# Patient Record
Sex: Female | Born: 1945 | Race: Black or African American | Hispanic: No | Smoking: Former smoker
Health system: Southern US, Community
[De-identification: ages and names within clinical notes are randomized; demographics above are authoritative.]

## PROBLEM LIST (undated history)

## (undated) DIAGNOSIS — M79671 Pain in right foot: Secondary | ICD-10-CM

## (undated) DIAGNOSIS — M545 Low back pain, unspecified: Secondary | ICD-10-CM

## (undated) DIAGNOSIS — I10 Essential (primary) hypertension: Secondary | ICD-10-CM

## (undated) DIAGNOSIS — N950 Postmenopausal bleeding: Secondary | ICD-10-CM

## (undated) DIAGNOSIS — E119 Type 2 diabetes mellitus without complications: Secondary | ICD-10-CM

## (undated) DIAGNOSIS — E7800 Pure hypercholesterolemia, unspecified: Secondary | ICD-10-CM

## (undated) DIAGNOSIS — I639 Cerebral infarction, unspecified: Secondary | ICD-10-CM

## (undated) DIAGNOSIS — M79672 Pain in left foot: Secondary | ICD-10-CM

## (undated) DIAGNOSIS — E538 Deficiency of other specified B group vitamins: Secondary | ICD-10-CM

## (undated) DIAGNOSIS — E782 Mixed hyperlipidemia: Secondary | ICD-10-CM

## (undated) DIAGNOSIS — K219 Gastro-esophageal reflux disease without esophagitis: Secondary | ICD-10-CM

## (undated) DIAGNOSIS — M5416 Radiculopathy, lumbar region: Secondary | ICD-10-CM

## (undated) HISTORY — DX: Essential (primary) hypertension: I10

## (undated) HISTORY — DX: Gastro-esophageal reflux disease without esophagitis: K21.9

## (undated) HISTORY — PX: COLPOSCOPY: SHX161

## (undated) HISTORY — DX: Pure hypercholesterolemia, unspecified: E78.00

## (undated) HISTORY — DX: Type 2 diabetes mellitus without complications: E11.9

---
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
## 2012-07-25 ENCOUNTER — Ambulatory Visit: Payer: Self-pay | Admitting: Internal Medicine

## 2013-09-11 ENCOUNTER — Ambulatory Visit: Payer: Self-pay | Admitting: Gastroenterology

## 2014-07-02 DIAGNOSIS — N950 Postmenopausal bleeding: Secondary | ICD-10-CM | POA: Diagnosis not present

## 2015-01-29 ENCOUNTER — Other Ambulatory Visit: Payer: Self-pay | Admitting: Internal Medicine

## 2015-01-29 DIAGNOSIS — Z1239 Encounter for other screening for malignant neoplasm of breast: Secondary | ICD-10-CM

## 2015-02-05 ENCOUNTER — Other Ambulatory Visit: Payer: Self-pay | Admitting: Internal Medicine

## 2015-02-05 ENCOUNTER — Ambulatory Visit
Admission: RE | Admit: 2015-02-05 | Discharge: 2015-02-05 | Disposition: A | Discharge status: Home / Self Care | Payer: Medicare Other | Source: Ambulatory Visit | Attending: Internal Medicine | Admitting: Internal Medicine

## 2015-02-05 DIAGNOSIS — R928 Other abnormal and inconclusive findings on diagnostic imaging of breast: Secondary | ICD-10-CM | POA: Insufficient documentation

## 2015-02-05 DIAGNOSIS — Z1239 Encounter for other screening for malignant neoplasm of breast: Secondary | ICD-10-CM

## 2015-02-05 DIAGNOSIS — Z1231 Encounter for screening mammogram for malignant neoplasm of breast: Secondary | ICD-10-CM | POA: Insufficient documentation

## 2015-02-15 ENCOUNTER — Other Ambulatory Visit: Payer: Self-pay | Admitting: Internal Medicine

## 2015-02-15 DIAGNOSIS — R921 Mammographic calcification found on diagnostic imaging of breast: Secondary | ICD-10-CM

## 2015-02-15 DIAGNOSIS — R928 Other abnormal and inconclusive findings on diagnostic imaging of breast: Secondary | ICD-10-CM

## 2015-03-03 ENCOUNTER — Ambulatory Visit
Admission: RE | Admit: 2015-03-03 | Discharge: 2015-03-03 | Disposition: A | Discharge status: Home / Self Care | Payer: Medicare Other | Source: Ambulatory Visit | Attending: Internal Medicine | Admitting: Internal Medicine

## 2015-03-03 ENCOUNTER — Encounter: Payer: Self-pay | Admitting: Obstetrics and Gynecology

## 2015-03-03 ENCOUNTER — Inpatient Hospital Stay: Payer: Medicare Other | Attending: Obstetrics and Gynecology | Admitting: Obstetrics and Gynecology

## 2015-03-03 ENCOUNTER — Ambulatory Visit: Payer: Medicare Other

## 2015-03-03 VITALS — BP 129/82 | HR 71 | Temp 98.2°F | Wt 188.7 lb

## 2015-03-03 DIAGNOSIS — R921 Mammographic calcification found on diagnostic imaging of breast: Secondary | ICD-10-CM | POA: Diagnosis not present

## 2015-03-03 DIAGNOSIS — R928 Other abnormal and inconclusive findings on diagnostic imaging of breast: Secondary | ICD-10-CM

## 2015-03-03 DIAGNOSIS — N950 Postmenopausal bleeding: Secondary | ICD-10-CM

## 2015-03-03 NOTE — Progress Notes (Signed)
Gynecologic Oncology Consult Visit   Referring Shenise Wolgamott: Dr. Atlee Abide  Chief Concern: postmenopausal vaginal spotting  Subjective:  Kara Rasmussen is a 69 y.o. G10 P7 female who is seen in consultation from Dr. Ilda Basset for postmenopausal spotting and suspected cervical stenosis. The spotting use to happen about once a year and now happens about once a month for the past 3-4 months.  She does not wear a pad, just occasionally sees a stain on her underwear.   Menopause since age 66.  In about 2011 she had some vaginal spotting evaluated in [place].  D&C attempted, but unable to locate her cervical canal. She declined hysterectomy.   Evaluated at [facility] since 2014 and recent US showed thin endometrium 4 mm. Uterus normal size and cervix only 8.7 mm long.   PAP and HPV normal.  Referred by Dr. Ilda Basset for opinion regarding whether hysteroscopy, D&C should be attempted again for evaluation, or possible hysterectomy.   Abnormal PAP 1989 and had colposcopy, but all normal since.  In questioning her about this, she says that a procedure was done in the OR and I suspect she probably had a cone biopsy.    Had 7 children, 2 by Cesarean section.  Sister had breast cancer at age 80.  Patient had neg BRCA1/2 testing.   Problem List: Patient Active Problem List   Diagnosis Date Noted  . Post-menopausal bleeding 03/03/2015    Past Medical History: Past Medical History  Diagnosis Date  . GERD (gastroesophageal reflux disease)   . Diabetes mellitus without complication     Type II  . Hypertension   . Hypercholesterolemia    Past Surgical History: see HPI  Family History: Family History  Problem Relation Age of Onset  . Breast cancer Sister 44    Social History: Social History   Social History  . Marital Status: Single    Spouse Name: N/A  . Number of Children: N/A  . Years of Education: N/A   Occupational History  . Not on file.   Social History Main Topics  .  Smoking status: Not on file  . Smokeless tobacco: Not on file  . Alcohol Use: Not on file  . Drug Use: Not on file  . Sexual Activity: Not on file   Other Topics Concern  . Not on file   Social History Narrative    Allergies: Allergies  Allergen Reactions  . Procaine Hives    Passed out    Current Medications: Current Outpatient Prescriptions  Medication Sig Dispense Refill  . aspirin 325 MG tablet Take 325 mg by mouth.    . folic acid (FOLVITE) 1 MG tablet Take 1 mg by mouth.    Marland Kitchen lisinopril-hydrochlorothiazide (PRINZIDE,ZESTORETIC) 10-12.5 MG tablet Take by mouth.    . metFORMIN (GLUCOPHAGE) 1000 MG tablet Take 1,000 mg by mouth.    . metFORMIN (GLUCOPHAGE) 1000 MG tablet     . omeprazole (PRILOSEC) 40 MG capsule Take by mouth.    . pravastatin (PRAVACHOL) 10 MG tablet Take 10 mg by mouth.     No current facility-administered medications for this visit.    Review of Systems General: negative for, fevers, chills, fatigue, changes in sleep, changes in weight or appetite Skin: negative for changes in color, texture, moles or lesions Eyes: negative for, changes in vision, pain, diplopia HEENT: negative for, change in hearing, pain, discharge, tinnitus, vertigo, voice changes, sore throat, neck masses Breasts: negative for breast lumps Pulmonary: negative for, dyspnea, orthopnea, productive cough Cardiac:  negative for, palpitations, syncope, pain, discomfort, pressure Gastrointestinal: negative for, dysphagia, nausea, vomiting, jaundice, pain, constipation, diarrhea, hematemesis, hematochezia Genitourinary/Sexual: negative for, dysuria, discharge, hesitancy, nocturia, retention, stones, infections, STD's, incontinence Ob/Gyn: negative for, irregular bleeding, pain Musculoskeletal: negative for, pain, stiffness, swelling, range of motion limitation Hematology: negative for, easy bruising, bleeding Neurologic/Psych: negative for, headaches, seizures, paralysis, weakness,  tremor, change in gait, change in sensation, mood swings, depression, anxiety, change in memory  Objective:  Physical Examination:  BP 129/82 mmHg  Pulse 71  Temp(Src) 98.2 F (36.8 C)  Wt 188 lb 11.4 oz (85.6 kg)   ECOG Performance Status: 0 - Asymptomatic  General appearance: alert, cooperative and appears stated age HEENT:PERRLA, extra ocular movement intact, neck supple with midline trachea and thyroid without masses Lymph node survey: non-palpable, axillary, inguinal, supraclavicular Cardiovascular: regular rate and rhythm, no murmurs or gallops Respiratory: normal air entry, lungs clear to auscultation and no rales, rhonchi or wheezing Breast exam: breasts appear normal, no suspicious masses, no skin or nipple changes or axillary nodes, not examined. Abdomen: soft, non-tender, without masses or organomegaly, no hernias and well healed incision Back: inspection of back is normal Extremities: extremities normal, atraumatic, no cyanosis or edema Skin exam - normal coloration and turgor, no rashes, no suspicious skin lesions noted. Neurological exam reveals alert, oriented, normal speech, no focal findings or movement disorder noted.  Pelvic: exam chaperoned by nurse;  Vulva: normal appearing vulva with no masses, tenderness or lesions; Vagina: normal vagina; Adnexa: normal adnexa in size, nontender and no masses no masses; Uterus: uterus is normal size, shape, consistency and nontender; Cervix: flush with the upper vault and not palpable, no lesions and the os is not identifiable; Rectal: not indicated and normal rectal, no masses    Assessment:  Kara Rasmussen is a 69 y.o. female diagnosed with intermittent vaginal spotting that is longstanding, but more frequent recently.  PAP smears and ultrasounds have been reassuring.  As best I can tell, she had a cone biopsy in 1989 and there is very little cervix present, and her endometrial thickness is normal.  I offered to try to perform  a hysteroscopy, D&C in the OR, but doubt it is technically possible.  There is no cervix palpable and no os visible.     Plan:   Problem List Items Addressed This Visit      Other   Post-menopausal bleeding - Primary     We discussed options for management including hysteroscopy and D&C, but based on my exam and the prior failed attempt in [place], it is unlikely that this will be successful even under anesthesia.  I also offered hysterectomy, but she does not want to do this, and I think this is reasonable given the lack of suspicion for cancer.  It is reassuring that she has had the spotting for some time and it is light and infrequent, and PAPs and [place] have been reassuring.  She would like to continue follow up with Dr Ilda Basset and agrees to repeat PAPs and ultrasounds.  I would recommend doing the next ultrasound in radiology in 4 months.  It may be worthwhile to try a course of premarin cream if this continues to be reassuring, as this might ameliorate this light spotting if it is due to atrophic vaginitis.      I would be happy to see her back again for follow up if needed.  I told her that if a hysterectomy was done at any point, we could try  to do this laparoscopically despite her prior Cesarean sections.    Mellody Drown, MD  CC:  Aletha Halim, MD [address] [place], [place] [address] [phone]

## 2015-03-03 NOTE — Progress Notes (Signed)
Patient seen for initial visit.  No complaints of pain or discomfort

## 2015-03-03 NOTE — Progress Notes (Signed)
Assisted MD with pelvic exam

## 2015-03-04 ENCOUNTER — Other Ambulatory Visit: Payer: Self-pay | Admitting: Internal Medicine

## 2015-03-04 DIAGNOSIS — R921 Mammographic calcification found on diagnostic imaging of breast: Secondary | ICD-10-CM

## 2015-03-10 ENCOUNTER — Ambulatory Visit: Payer: Medicare Other

## 2015-03-10 ENCOUNTER — Other Ambulatory Visit: Payer: Self-pay | Admitting: Internal Medicine

## 2015-03-10 ENCOUNTER — Ambulatory Visit
Admission: RE | Admit: 2015-03-10 | Discharge: 2015-03-10 | Disposition: A | Discharge status: Home / Self Care | Payer: Medicare Other | Source: Ambulatory Visit | Attending: Internal Medicine | Admitting: Internal Medicine

## 2015-03-10 DIAGNOSIS — R928 Other abnormal and inconclusive findings on diagnostic imaging of breast: Secondary | ICD-10-CM | POA: Diagnosis present

## 2015-03-10 DIAGNOSIS — R921 Mammographic calcification found on diagnostic imaging of breast: Secondary | ICD-10-CM

## 2015-12-02 ENCOUNTER — Other Ambulatory Visit: Payer: Self-pay | Admitting: Physician Assistant

## 2015-12-02 DIAGNOSIS — R921 Mammographic calcification found on diagnostic imaging of breast: Secondary | ICD-10-CM

## 2016-02-08 ENCOUNTER — Other Ambulatory Visit: Payer: Medicare Other

## 2016-07-24 ENCOUNTER — Ambulatory Visit: Payer: Medicare Other

## 2016-07-24 ENCOUNTER — Other Ambulatory Visit: Payer: Medicare Other

## 2016-08-10 ENCOUNTER — Ambulatory Visit
Admission: RE | Admit: 2016-08-10 | Discharge: 2016-08-10 | Disposition: A | Discharge status: Home / Self Care | Payer: Medicare Other | Source: Ambulatory Visit | Attending: Physician Assistant | Admitting: Physician Assistant

## 2016-08-10 DIAGNOSIS — R921 Mammographic calcification found on diagnostic imaging of breast: Secondary | ICD-10-CM

## 2016-09-15 ENCOUNTER — Ambulatory Visit: Admission: RE | Admit: 2016-09-15 | Payer: Medicare Other | Source: Ambulatory Visit

## 2016-09-15 ENCOUNTER — Ambulatory Visit
Admission: RE | Admit: 2016-09-15 | Discharge: 2016-09-15 | Disposition: A | Discharge status: Home / Self Care | Payer: Medicare Other | Source: Ambulatory Visit | Attending: Physician Assistant | Admitting: Physician Assistant

## 2016-09-15 DIAGNOSIS — R921 Mammographic calcification found on diagnostic imaging of breast: Secondary | ICD-10-CM | POA: Diagnosis present

## 2017-08-27 ENCOUNTER — Other Ambulatory Visit: Payer: Self-pay | Admitting: Physician Assistant

## 2017-08-27 DIAGNOSIS — Z1231 Encounter for screening mammogram for malignant neoplasm of breast: Secondary | ICD-10-CM

## 2017-09-18 ENCOUNTER — Ambulatory Visit
Admission: RE | Admit: 2017-09-18 | Discharge: 2017-09-18 | Disposition: A | Discharge status: Home / Self Care | Payer: Medicare Other | Source: Ambulatory Visit | Attending: Physician Assistant | Admitting: Physician Assistant

## 2017-09-18 DIAGNOSIS — Z1231 Encounter for screening mammogram for malignant neoplasm of breast: Secondary | ICD-10-CM

## 2018-02-09 ENCOUNTER — Emergency Department
Admission: EM | Admit: 2018-02-09 | Discharge: 2018-02-09 | Disposition: A | Discharge status: Home / Self Care | Payer: Medicare Other | Attending: Emergency Medicine | Admitting: Emergency Medicine

## 2018-02-09 ENCOUNTER — Other Ambulatory Visit: Payer: Self-pay

## 2018-02-09 ENCOUNTER — Emergency Department: Payer: Medicare Other

## 2018-02-09 DIAGNOSIS — I10 Essential (primary) hypertension: Secondary | ICD-10-CM | POA: Insufficient documentation

## 2018-02-09 DIAGNOSIS — Z79899 Other long term (current) drug therapy: Secondary | ICD-10-CM | POA: Diagnosis not present

## 2018-02-09 DIAGNOSIS — R51 Headache: Secondary | ICD-10-CM | POA: Insufficient documentation

## 2018-02-09 DIAGNOSIS — Z87891 Personal history of nicotine dependence: Secondary | ICD-10-CM | POA: Diagnosis not present

## 2018-02-09 DIAGNOSIS — R202 Paresthesia of skin: Secondary | ICD-10-CM | POA: Diagnosis not present

## 2018-02-09 DIAGNOSIS — Z8673 Personal history of transient ischemic attack (TIA), and cerebral infarction without residual deficits: Secondary | ICD-10-CM | POA: Insufficient documentation

## 2018-02-09 DIAGNOSIS — Z7984 Long term (current) use of oral hypoglycemic drugs: Secondary | ICD-10-CM | POA: Insufficient documentation

## 2018-02-09 DIAGNOSIS — E119 Type 2 diabetes mellitus without complications: Secondary | ICD-10-CM | POA: Diagnosis not present

## 2018-02-09 DIAGNOSIS — R519 Headache, unspecified: Secondary | ICD-10-CM

## 2018-02-09 LAB — CBC WITH DIFFERENTIAL/PLATELET
Basophils Absolute: 0.1 10*3/uL (ref 0–0.1)
Basophils Relative: 1 %
EOS PCT: 3 %
Eosinophils Absolute: 0.3 10*3/uL (ref 0–0.7)
HCT: 38.5 % (ref 35.0–47.0)
Hemoglobin: 12.2 g/dL (ref 12.0–16.0)
LYMPHS ABS: 2.4 10*3/uL (ref 1.0–3.6)
LYMPHS PCT: 29 %
MCH: 22.1 pg — AB (ref 26.0–34.0)
MCHC: 31.6 g/dL — ABNORMAL LOW (ref 32.0–36.0)
MCV: 70 fL — AB (ref 80.0–100.0)
MONO ABS: 0.5 10*3/uL (ref 0.2–0.9)
Monocytes Relative: 6 %
Neutro Abs: 5 10*3/uL (ref 1.4–6.5)
Neutrophils Relative %: 61 %
PLATELETS: 309 10*3/uL (ref 150–440)
RBC: 5.51 MIL/uL — AB (ref 3.80–5.20)
RDW: 17 % — AB (ref 11.5–14.5)
WBC: 8.3 10*3/uL (ref 3.6–11.0)

## 2018-02-09 LAB — URINALYSIS, COMPLETE (UACMP) WITH MICROSCOPIC
Bacteria, UA: NONE SEEN
Bilirubin Urine: NEGATIVE
GLUCOSE, UA: NEGATIVE mg/dL
HGB URINE DIPSTICK: NEGATIVE
KETONES UR: NEGATIVE mg/dL
NITRITE: NEGATIVE
PH: 6 (ref 5.0–8.0)
Protein, ur: 30 mg/dL — AB
SPECIFIC GRAVITY, URINE: 1.024 (ref 1.005–1.030)
WBC, UA: >50 WBC/hpf — ABNORMAL HIGH (ref 0–5)

## 2018-02-09 LAB — BASIC METABOLIC PANEL
Anion gap: 12 (ref 5–15)
BUN: 6 mg/dL — AB (ref 8–23)
CALCIUM: 9.3 mg/dL (ref 8.9–10.3)
CHLORIDE: 103 mmol/L (ref 98–111)
CO2: 27 mmol/L (ref 22–32)
Creatinine, Ser: 0.9 mg/dL (ref 0.44–1.00)
GFR calc Af Amer: >60 mL/min (ref >60)
GFR calc non Af Amer: >60 mL/min (ref >60)
GLUCOSE: 142 mg/dL — AB (ref 70–99)
Potassium: 3.7 mmol/L (ref 3.5–5.1)
Sodium: 142 mmol/L (ref 135–145)

## 2018-02-09 MED ORDER — ONDANSETRON HCL 4 MG PO TABS: 4.0000 mg | ORAL_TABLET | Freq: Three times a day (TID) | ORAL | 0 refills | Status: DC | PRN

## 2018-02-09 MED ORDER — LIDOCAINE HCL URETHRAL/MUCOSAL 2 % EX GEL
1.0000 "application " | Freq: Once | CUTANEOUS | Status: AC
  Administered 2018-02-09: 1 via TOPICAL
  Filled 2018-02-09: qty 10

## 2018-02-09 NOTE — ED Notes (Signed)
Pt speaks to MRI tech on phone for screening.

## 2018-02-09 NOTE — Discharge Instructions (Signed)
You are evaluated for headache, and although no certain cause was found, your exam and evaluation are overall reassuring in the emergency  department today.  We discussed that the pressure in your left eye is slightly elevated and you do need to see an eye doctor within 1 week, [place] eye number is provided, please call to make appointment.  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  Discussed further follow-up with your primary care doctor.  At onset of headache you may try over-the-counter ibuprofen 600 mg with over-the-counter Benadryl 25 mg and prescription Zofran to see if this helps migraine type headache.  Return to the emergency department immediately for any worsening or uncontrolled headache, vision changes, fever, skin rash, weakness, numbness, confusion or altered mental status, or any other symptoms concerning to you.

## 2018-02-09 NOTE — ED Triage Notes (Addendum)
Pt arrives to ED via POV from home with c/o left-sided headache x2 days. Pt also reports that her right-side visual changes that appear like "water running down". Pt (+) nausea, but denies vomiting. No photophobia or sound sensitivity. No facial droop, pt MAEW, no difficulty speaking or understanding speech.

## 2018-02-09 NOTE — ED Provider Notes (Signed)
[facility] Emergency Department Provider Note ____________________________________________   I have reviewed the triage vital signs and the triage nursing note.  HISTORY  Chief Complaint Headache   Historian Patient  HPI Kara Rasmussen is a 72 y.o. female history of diabetes, hypertension, hyperlipidemia presents for evaluation of headache which is been intermittent/waxing and waning since Wednesday.  Onset was in the evening on Wednesday when she was driving she had a frontal headache and sensation of water going down the right side of her face, but there was no sweating or tearing.  States that headache was intermittent on Thursday especially in the later afternoon and was moderate to severe.  At this point it was located behind the left eye.  On Friday she had some timeframe without headache but then it returned in the left eye or behind the left eye, she thought that she may have had a mild visual change because when she was reading at the streets on her phone while driving she could see the first 3 letters but then not the second 3 letters.  She had a TIA several years ago, that was associated with speech changes/slurred speech.  There is been no speech changes today.  Denies focal weakness or numbness of the face arms or legs.  Reports about a week of numbness to the top of the left foot.  She has "neuropathy "and has seen podiatry about a week ago for foot pain and was told about the neuropathy and told she should follow-up with a neurologist.  No chest pain.  No fevers.  Currently the headache is much improved, she states "just barely there.  "     Past Medical History:  Diagnosis Date  . Diabetes mellitus without complication (HCC)    Type II  . GERD (gastroesophageal reflux disease)   . Hypercholesterolemia   . Hypertension     Patient Active Problem List   Diagnosis Date Noted  . Post-menopausal bleeding 03/03/2015    History reviewed. No  pertinent surgical history.  Prior to Admission medications   Medication Sig Start Date End Date Taking? Authorizing Provider  folic acid (FOLVITE) 1 MG tablet Take 1 mg by mouth daily.    Yes [provider]  lisinopril-hydrochlorothiazide (PRINZIDE,ZESTORETIC) 10-12.5 MG tablet Take 1 tablet by mouth daily.    Yes [provider]  metFORMIN (GLUCOPHAGE) 1000 MG tablet Take 1,000 mg by mouth 2 (two) times daily.    Yes [provider]  omeprazole (PRILOSEC) 40 MG capsule Take 40 mg by mouth daily.    Yes [provider]  pravastatin (PRAVACHOL) 10 MG tablet Take 40 mg by mouth daily.    Yes [provider]  aspirin 325 MG tablet Take 325 mg by mouth.    [provider]  ondansetron (ZOFRAN) 4 MG tablet Take 1 tablet (4 mg total) by mouth every 8 (eight) hours as needed for nausea or vomiting. 02/09/18   Governor Rooks, MD    Allergies  Allergen Reactions  . Procaine Hives    Passed out    Family History  Problem Relation Age of Onset  . Breast cancer Sister 40    Social History Social History   Tobacco Use  . Smoking status: Former Games developer  . Smokeless tobacco: Never Used  Substance Use Topics  . Alcohol use: Not on file  . Drug use: Not on file    Review of Systems  Constitutional: Negative for fever. Eyes: As per HPI, questionable intermittent  vision changes to the left eye currently normal vision reported. ENT: Negative for sore throat. Cardiovascular: Negative for chest pain. Respiratory: Negative for shortness of breath. Gastrointestinal: Negative for abdominal pain, vomiting and diarrhea. Genitourinary: Negative for dysuria. Musculoskeletal: Negative for back pain. Skin: Negative for rash. Neurological: Positive as per HPI for headache.  ____________________________________________   PHYSICAL EXAM:  VITAL SIGNS: ED Triage Vitals  Enc Vitals Group     BP 02/09/18 0700 (!) 163/95     Pulse Rate 02/09/18  0700 86     Resp 02/09/18 0700 18     Temp 02/09/18 0700 98.2 F (36.8 C)     Temp Source 02/09/18 0700 Oral     SpO2 02/09/18 0700 99 %     Weight 02/09/18 0655 189 lb 9.5 oz (86 kg)     Height 02/09/18 0655 5\' 8"  (1.727 m)     Head Circumference --      Peak Flow --      Pain Score 02/09/18 0653 10     Pain Loc --      Pain Edu? --      Excl. in GC? --      Constitutional: Alert and oriented.  HEENT      Head: Normocephalic and atraumatic.      Eyes: Conjunctivae are normal. Pupils equal and round.  Extraocular movements intact.  No pain with extraocular movements.  She initially seem to be holding her left eye more closed, but she is able to open and close her eyes with no strength deficits.      Ears:         Nose: No congestion/rhinnorhea.      Mouth/Throat: Mucous membranes are moist.      Neck: No stridor. Cardiovascular/Chest: Normal rate, regular rhythm.  No murmurs, rubs, or gallops. Respiratory: Normal respiratory effort without tachypnea nor retractions. Breath sounds are clear and equal bilaterally. No wheezes/rales/rhonchi. Gastrointestinal: Soft. No distention, no guarding, no rebound. Nontender.    Genitourinary/rectal:Deferred Musculoskeletal: Nontender with normal range of motion in all extremities. No joint effusions.  No lower extremity tenderness.  No edema. Neurologic: No facial droop.  Normal speech and language.  5 out of 5 strength in 4 extremities.  Reports paresthesias the left top of the foot and ankle area. skin:  Skin is warm, dry and intact. No rash noted. Psychiatric: Mood and affect are normal. Speech and behavior are normal. Patient exhibits appropriate insight and judgment.   ____________________________________________  LABS (pertinent positives/negatives) I, Governor Rooks, MD the attending physician have reviewed the labs noted below.  Labs Reviewed  URINALYSIS, COMPLETE (UACMP) WITH MICROSCOPIC - Abnormal; Notable for the following  components:      Result Value   Color, Urine YELLOW (*)    APPearance HAZY (*)    Protein, ur 30 (*)    Leukocytes, UA MODERATE (*)    WBC, UA >50 (*)    All other components within normal limits  BASIC METABOLIC PANEL - Abnormal; Notable for the following components:   Glucose, Bld 142 (*)    BUN 6 (*)    All other components within normal limits  CBC WITH DIFFERENTIAL/PLATELET - Abnormal; Notable for the following components:   RBC 5.51 (*)    MCV 70.0 (*)    MCH 22.1 (*)    MCHC 31.6 (*)    RDW 17.0 (*)    All other components within normal limits  URINE CULTURE    ____________________________________________  EKG I, Governor Rooks, MD, the attending physician have personally viewed and interpreted all ECGs.  None ____________________________________________  RADIOLOGY   CT head without contrast.  Radiologist report reviewed: IMPRESSION: 1. 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 Ex vacuo dilatation of the adjacent frontal horn of the low left lateral ventricle suggest the findings are chronic. No definitive acute intracranial abnormality noted.  __________________________________________  PROCEDURES  Procedure(s) performed: None  Procedures  Intraocular pressure: Right eye average 20, left eye average 29  Critical Care performed: None   ____________________________________________  ED COURSE / ASSESSMENT AND PLAN  Pertinent labs & imaging results that were available during my care of the patient were reviewed by me and considered in my medical decision making (see chart for details).   Patient presented for waxing waning headache since Wednesday, currently much improved.  Initially she stated she was not having any vision changes or neurologic symptoms in terms of weakness or numbness but when I spoke with her further she stated that she had a little bit of  questionable vision change yesterday, none currently.  She stated that she has been having numbness to the left foot for about a week that she was told recently may be neuropathy.   CT head without acute finding.  We discussed obtaining MRI.  Intraocular pressures were checked in the left eye which was the affected location was slightly elevated at 29 with comparison to about 18-20 on the right eye.  She is not having current visual changes.  MRI showed no acute findings, but I did discuss with her the findings of prior old strokes and she is already on 325 mg aspirin.  I have asked her to follow-up with her primary care doctor for management of risk factors.  She was given a copy of her MRI result.  I spoke with Dr. Brooke Dare, ophthalmology about the complaints and the slightly elevated left intraocular pressure, and he recommended no additional emergency treatment, but follow-up this week at [facility].  We discussed nonspecific headache may be related to tension versus migraine type headache.  We discussed trial over-the-counter medications at onset of headache with ibuprofen and Zofran and Benadryl to see if that may help.   CONSULTATIONS: None  Patient / Family / Caregiver informed of clinical course, medical decision-making process, and agree with plan.   I discussed return precautions, follow-up instructions, and discharge instructions with patient and/or family.  Discharge Instructions : You are evaluated for headache, and although no certain cause was found, your exam and evaluation are overall reassuring in the emergency  department today.  We discussed that the pressure in your left eye is slightly elevated and you do need to see an eye doctor within 1 week, [facility] eye number is provided, please call to make appointment.  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  Discussed further follow-up with your primary  care doctor.  At onset of headache you may try over-the-counter ibuprofen 600 mg with over-the-counter Benadryl 25 mg and prescription Zofran to see if this helps migraine type headache.  Return to the emergency department immediately for any worsening or uncontrolled headache, vision changes, fever, skin rash, weakness, numbness, confusion or altered mental status, or any other symptoms concerning to you.    ___________________________________________   FINAL CLINICAL IMPRESSION(S) / ED DIAGNOSES   Final diagnoses:  Acute nonintractable  headache, unspecified headache type      ___________________________________________         Note: This dictation was prepared with Dragon dictation. Any transcriptional errors that result from this process are unintentional    Governor Rooks, MD 02/09/18 1452

## 2018-02-09 NOTE — ED Notes (Addendum)
Pt taken to MRI via w/c, pt carried belongings with her

## 2018-02-10 LAB — URINE CULTURE: Culture: <10000 — AB

## 2018-05-23 ENCOUNTER — Other Ambulatory Visit: Payer: Self-pay | Admitting: Physician Assistant

## 2018-05-23 DIAGNOSIS — Z1231 Encounter for screening mammogram for malignant neoplasm of breast: Secondary | ICD-10-CM

## 2018-11-18 ENCOUNTER — Inpatient Hospital Stay
Admission: RE | Admit: 2018-11-18 | Discharge: 2018-11-18 | Disposition: A | Discharge status: Home / Self Care | Payer: Medicare Other | Source: Ambulatory Visit | Attending: Family Medicine | Admitting: Family Medicine

## 2018-11-18 ENCOUNTER — Inpatient Hospital Stay
Admission: EM | Admit: 2018-11-18 | Discharge: 2018-11-20 | DRG: 066 | Disposition: A | Discharge status: Home / Self Care | Payer: Medicare Other | Attending: Internal Medicine | Admitting: Internal Medicine

## 2018-11-18 ENCOUNTER — Other Ambulatory Visit: Payer: Self-pay

## 2018-11-18 ENCOUNTER — Encounter: Payer: Self-pay | Admitting: Emergency Medicine

## 2018-11-18 ENCOUNTER — Other Ambulatory Visit: Payer: Self-pay | Admitting: Family Medicine

## 2018-11-18 DIAGNOSIS — R4781 Slurred speech: Secondary | ICD-10-CM

## 2018-11-18 DIAGNOSIS — Z66 Do not resuscitate: Secondary | ICD-10-CM | POA: Diagnosis present

## 2018-11-18 DIAGNOSIS — Z1159 Encounter for screening for other viral diseases: Secondary | ICD-10-CM

## 2018-11-18 DIAGNOSIS — F70 Mild intellectual disabilities: Secondary | ICD-10-CM

## 2018-11-18 DIAGNOSIS — I63512 Cerebral infarction due to unspecified occlusion or stenosis of left middle cerebral artery: Secondary | ICD-10-CM | POA: Diagnosis not present

## 2018-11-18 DIAGNOSIS — I639 Cerebral infarction, unspecified: Secondary | ICD-10-CM

## 2018-11-18 DIAGNOSIS — G459 Transient cerebral ischemic attack, unspecified: Secondary | ICD-10-CM

## 2018-11-18 DIAGNOSIS — R4701 Aphasia: Secondary | ICD-10-CM | POA: Diagnosis present

## 2018-11-18 DIAGNOSIS — Z7982 Long term (current) use of aspirin: Secondary | ICD-10-CM

## 2018-11-18 DIAGNOSIS — Z87891 Personal history of nicotine dependence: Secondary | ICD-10-CM

## 2018-11-18 DIAGNOSIS — R42 Dizziness and giddiness: Secondary | ICD-10-CM | POA: Diagnosis not present

## 2018-11-18 DIAGNOSIS — K219 Gastro-esophageal reflux disease without esophagitis: Secondary | ICD-10-CM | POA: Diagnosis present

## 2018-11-18 DIAGNOSIS — R29703 NIHSS score 3: Secondary | ICD-10-CM | POA: Diagnosis present

## 2018-11-18 DIAGNOSIS — Z7984 Long term (current) use of oral hypoglycemic drugs: Secondary | ICD-10-CM

## 2018-11-18 DIAGNOSIS — E1151 Type 2 diabetes mellitus with diabetic peripheral angiopathy without gangrene: Secondary | ICD-10-CM | POA: Diagnosis present

## 2018-11-18 DIAGNOSIS — Z79899 Other long term (current) drug therapy: Secondary | ICD-10-CM

## 2018-11-18 DIAGNOSIS — I10 Essential (primary) hypertension: Secondary | ICD-10-CM | POA: Diagnosis present

## 2018-11-18 DIAGNOSIS — R4189 Other symptoms and signs involving cognitive functions and awareness: Secondary | ICD-10-CM

## 2018-11-18 DIAGNOSIS — Z803 Family history of malignant neoplasm of breast: Secondary | ICD-10-CM

## 2018-11-18 DIAGNOSIS — E785 Hyperlipidemia, unspecified: Secondary | ICD-10-CM | POA: Diagnosis present

## 2018-11-18 DIAGNOSIS — Z888 Allergy status to other drugs, medicaments and biological substances status: Secondary | ICD-10-CM

## 2018-11-18 DIAGNOSIS — E7800 Pure hypercholesterolemia, unspecified: Secondary | ICD-10-CM | POA: Diagnosis present

## 2018-11-18 DIAGNOSIS — R29810 Facial weakness: Secondary | ICD-10-CM | POA: Diagnosis present

## 2018-11-18 HISTORY — DX: Cerebral infarction, unspecified: I63.9

## 2018-11-18 LAB — COMPREHENSIVE METABOLIC PANEL
ALT: 18 U/L (ref 0–44)
AST: 19 U/L (ref 15–41)
Albumin: 4.4 g/dL (ref 3.5–5.0)
Alkaline Phosphatase: 100 U/L (ref 38–126)
Anion gap: 13 (ref 5–15)
BUN: 8 mg/dL (ref 8–23)
CO2: 23 mmol/L (ref 22–32)
Calcium: 9.4 mg/dL (ref 8.9–10.3)
Chloride: 105 mmol/L (ref 98–111)
Creatinine, Ser: 0.84 mg/dL (ref 0.44–1.00)
GFR calc Af Amer: >60 mL/min (ref >60)
GFR calc non Af Amer: >60 mL/min (ref >60)
Glucose, Bld: 102 mg/dL — ABNORMAL HIGH (ref 70–99)
Potassium: 3.6 mmol/L (ref 3.5–5.1)
Sodium: 141 mmol/L (ref 135–145)
Total Bilirubin: 0.3 mg/dL (ref 0.3–1.2)
Total Protein: 7.3 g/dL (ref 6.5–8.1)

## 2018-11-18 LAB — SARS CORONAVIRUS 2 BY RT PCR (HOSPITAL ORDER, PERFORMED IN ~~LOC~~ HOSPITAL LAB): SARS Coronavirus 2: NEGATIVE

## 2018-11-18 LAB — CBC
HCT: 37.4 % (ref 36.0–46.0)
Hemoglobin: 11.4 g/dL — ABNORMAL LOW (ref 12.0–15.0)
MCH: 21.3 pg — ABNORMAL LOW (ref 26.0–34.0)
MCHC: 30.5 g/dL (ref 30.0–36.0)
MCV: 69.9 fL — ABNORMAL LOW (ref 80.0–100.0)
Platelets: 342 10*3/uL (ref 150–400)
RBC: 5.35 MIL/uL — ABNORMAL HIGH (ref 3.87–5.11)
RDW: 17.2 % — ABNORMAL HIGH (ref 11.5–15.5)
WBC: 7.5 10*3/uL (ref 4.0–10.5)
nRBC: 0 % (ref 0.0–0.2)

## 2018-11-18 LAB — PROTIME-INR
INR: 1 (ref 0.8–1.2)
Prothrombin Time: 12.9 seconds (ref 11.4–15.2)

## 2018-11-18 LAB — GLUCOSE, CAPILLARY: Glucose-Capillary: 153 mg/dL — ABNORMAL HIGH (ref 70–99)

## 2018-11-18 LAB — TROPONIN I: Troponin I: <0.03 ng/mL (ref <0.03)

## 2018-11-18 MED ORDER — ACETAMINOPHEN 650 MG RE SUPP: 650.0000 mg | RECTAL | Status: DC | PRN

## 2018-11-18 MED ORDER — LISINOPRIL-HYDROCHLOROTHIAZIDE 10-12.5 MG PO TABS: 1.0000 | ORAL_TABLET | Freq: Every day | ORAL | Status: DC

## 2018-11-18 MED ORDER — PRAVASTATIN SODIUM 20 MG PO TABS
40.0000 mg | ORAL_TABLET | Freq: Every day | ORAL | Status: DC
  Administered 2018-11-18 – 2018-11-19 (×2): 40 mg via ORAL
  Filled 2018-11-18: qty 2

## 2018-11-18 MED ORDER — PANTOPRAZOLE SODIUM 40 MG PO TBEC
40.0000 mg | DELAYED_RELEASE_TABLET | Freq: Every day | ORAL | Status: DC
  Administered 2018-11-19 – 2018-11-20 (×2): 40 mg via ORAL
  Filled 2018-11-18 (×2): qty 1

## 2018-11-18 MED ORDER — ONDANSETRON HCL 4 MG/2ML IJ SOLN: 4.0000 mg | Freq: Four times a day (QID) | INTRAMUSCULAR | Status: DC | PRN

## 2018-11-18 MED ORDER — FOLIC ACID 1 MG PO TABS
1.0000 mg | ORAL_TABLET | Freq: Every day | ORAL | Status: DC
  Administered 2018-11-19 – 2018-11-20 (×2): 1 mg via ORAL
  Filled 2018-11-18 (×2): qty 1

## 2018-11-18 MED ORDER — ACETAMINOPHEN 160 MG/5ML PO SOLN
650.0000 mg | ORAL | Status: DC | PRN
  Filled 2018-11-18: qty 20.3

## 2018-11-18 MED ORDER — INSULIN ASPART 100 UNIT/ML ~~LOC~~ SOLN
0.0000 [IU] | Freq: Three times a day (TID) | SUBCUTANEOUS | Status: DC
  Filled 2018-11-18: qty 1

## 2018-11-18 MED ORDER — LISINOPRIL 10 MG PO TABS
10.0000 mg | ORAL_TABLET | Freq: Every day | ORAL | Status: DC
  Administered 2018-11-19 – 2018-11-20 (×2): 10 mg via ORAL
  Filled 2018-11-18 (×2): qty 1

## 2018-11-18 MED ORDER — HYDROCHLOROTHIAZIDE 12.5 MG PO CAPS
12.5000 mg | ORAL_CAPSULE | Freq: Every day | ORAL | Status: DC
  Administered 2018-11-19 – 2018-11-20 (×2): 12.5 mg via ORAL
  Filled 2018-11-18 (×2): qty 1

## 2018-11-18 MED ORDER — ASPIRIN EC 81 MG PO TBEC
81.0000 mg | DELAYED_RELEASE_TABLET | Freq: Every day | ORAL | Status: DC
  Administered 2018-11-19: 81 mg via ORAL
  Filled 2018-11-18: qty 1

## 2018-11-18 MED ORDER — ENOXAPARIN SODIUM 40 MG/0.4ML ~~LOC~~ SOLN
40.0000 mg | SUBCUTANEOUS | Status: DC
  Administered 2018-11-18 – 2018-11-19 (×2): 40 mg via SUBCUTANEOUS
  Filled 2018-11-18 (×2): qty 0.4

## 2018-11-18 MED ORDER — ACETAMINOPHEN 325 MG PO TABS: 650.0000 mg | ORAL_TABLET | ORAL | Status: DC | PRN

## 2018-11-18 MED ORDER — STROKE: EARLY STAGES OF RECOVERY BOOK
Freq: Once | Status: AC
  Administered 2018-11-18: 23:00:00

## 2018-11-18 NOTE — H&P (Signed)
[facility] - [place] at [facility]   PATIENT NAME: Kara LarkKaren Kuper    MR#:  [id number]  DATE OF BIRTH:  03/20/1946  DATE OF ADMISSION:  11/18/2018  PRIMARY CARE PHYSICIAN: Patrice ParadiseMcLaughlin, Miriam K, MD   REQUESTING/REFERRING PHYSICIAN: Dr. Lenard LancePaduchowski  CHIEF COMPLAINT:   Chief Complaint  Patient presents with  . Dizziness  . Aphasia    HISTORY OF PRESENT ILLNESS:  Kara Rasmussen  is a 73 y.o. 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  Right facial droop found on arrival.  Here in the emergency room a CT scan of the head was done which showed nothing acute.  MRI of the brain shows left MCA territory CVA.  Patient at this time has no focal weakness or numbness.  Her only issue seems to be mild right facial droop.  She is being admitted for further work-up of acute CVA.  She is on Lipitor, aspirin at home. [facility] in the past but does not want to follow-up with him after discharge.  PAST MEDICAL HISTORY:   Past Medical History:  Diagnosis Date  . Diabetes mellitus without complication (HCC)    Type II  . GERD (gastroesophageal reflux disease)   . Hypercholesterolemia   . Hypertension     PAST SURGICAL HISTORY:  History reviewed. No pertinent surgical history.  SOCIAL HISTORY:   Social History   Tobacco Use  . Smoking status: Former Games developermoker  . Smokeless tobacco: Never Used  Substance Use Topics  . Alcohol use: Not on file    FAMILY HISTORY:   Family History  Problem Relation Age of Onset  . Breast cancer Sister 30    DRUG ALLERGIES:   Allergies  Allergen Reactions  . Procaine Hives    Passed out    REVIEW OF SYSTEMS:   Review of Systems  Constitutional: Positive for malaise/fatigue. Negative for chills and fever.  HENT: Negative for sore throat.   Eyes: Negative  for blurred vision, double vision and pain.  Respiratory: Negative for cough, hemoptysis, shortness of breath and wheezing.   Cardiovascular: Negative for chest pain, palpitations, orthopnea and leg swelling.  Gastrointestinal: Negative for abdominal pain, constipation, diarrhea, heartburn, nausea and vomiting.  Genitourinary: Negative for dysuria and hematuria.  Musculoskeletal: Negative for back pain and joint pain.  Skin: Negative for rash.  Neurological: Positive for speech change. Negative for sensory change, focal weakness and headaches.  Endo/Heme/Allergies: Does not bruise/bleed easily.  Psychiatric/Behavioral: Negative for depression. The patient is not nervous/anxious.     MEDICATIONS AT HOME:   Prior to Admission medications   Medication Sig Start Date End Date Taking? Authorizing Provider  aspirin EC 81 MG tablet Take 81 mg by mouth daily.   Yes [provider]  Cannabidiol 100 MG/ML SOLN Take 100 mg by mouth as directed.   Yes [provider]  folic acid (FOLVITE) 1 MG tablet Take 1 mg by mouth daily.    Yes [provider]  lisinopril-hydrochlorothiazide (ZESTORETIC) 10-12.5 MG tablet Take 1 tablet by mouth daily. 09/06/18  Yes [provider]  metFORMIN (GLUCOPHAGE) 1000 MG tablet Take 1,000 mg by mouth 2 (two) times daily. 09/06/18  Yes [provider]  omeprazole (PRILOSEC) 40 MG capsule Take 40 mg by mouth daily. 09/06/18  Yes [provider]  PRAVACHOL 40 MG tablet Take  40 mg by mouth daily. 09/06/18  Yes [provider]     VITAL SIGNS:  Blood pressure (!) 158/78, pulse 80, temperature 97.8 F (36.6 C), resp. rate (!) 26, height 5\' 8"  (1.727 m), weight 93 kg, SpO2 100 %.  PHYSICAL EXAMINATION:  Physical Exam  GENERAL:  73 y.o.-year-old patient lying in the bed with no acute distress.  EYES: Pupils equal, round, reactive to light and accommodation. No scleral icterus. Extraocular muscles intact.  HEENT: Head  atraumatic, normocephalic. Oropharynx and nasopharynx clear. No oropharyngeal erythema, moist oral mucosa  NECK:  Supple, no jugular venous distention. No thyroid enlargement, no tenderness.  LUNGS: Normal breath sounds bilaterally, no wheezing, rales, rhonchi. No use of accessory muscles of respiration.  CARDIOVASCULAR: S1, S2 normal. No murmurs, rubs, or gallops.  ABDOMEN: Soft, nontender, nondistended. Bowel sounds present. No organomegaly or mass.  EXTREMITIES: No pedal edema, cyanosis, or clubbing. + 2 pedal & radial pulses b/l.   NEUROLOGIC: Cranial nerves II through XII are intact. No focal Motor or sensory deficits appreciated b/l Right facial droop PSYCHIATRIC: The patient is alert and oriented x 3. Good affect.  SKIN: No obvious rash, lesion, or ulcer.   LABORATORY PANEL:   CBC Recent Labs  Lab 11/18/18 1704  WBC 7.5  HGB 11.4*  HCT 37.4  PLT 342   ------------------------------------------------------------------------------------------------------------------  Chemistries  Recent Labs  Lab 11/18/18 1704  NA 141  K 3.6  CL 105  CO2 23  GLUCOSE 102*  BUN 8  CREATININE 0.84  CALCIUM 9.4  AST 19  ALT 18  ALKPHOS 100  BILITOT 0.3   ------------------------------------------------------------------------------------------------------------------  Cardiac Enzymes Recent Labs  Lab 11/18/18 1704  TROPONINI <0.03   ------------------------------------------------------------------------------------------------------------------  RADIOLOGY:  Mr Brain Wo Contrast  Result Date: 11/18/2018 CLINICAL DATA:  Mild mental slowing with slurred speech. Cognitive change. EXAM: MRI HEAD WITHOUT CONTRAST TECHNIQUE: Multiplanar, multiecho pulse sequences of the brain and surrounding structures were obtained without intravenous contrast. COMPARISON:  02/09/2018 MRI FINDINGS: Brain: There is an acute LEFT MCA territory infarct affecting the lateral lentiform nucleus and  periventricular white matter, lenticulostriate artery territory. This is adjacent to, and slightly posterior and lateral to an old infarct in roughly the same distribution. Chronic ex vacuo enlargement LEFT lateral ventricle. Mild cerebral and cerebellar atrophy. Mild subcortical and periventricular T2 and FLAIR hyperintensities, likely chronic microvascular ischemic change. Regional gliosis of the white matter accompanies the old infarct and ex vacuo ventricular enlargement. Vascular: Normal flow voids. Specifically, the LEFT ICA and LEFT MCA appear normal. Skull and upper cervical spine: Normal marrow signal. Sinuses/Orbits: Negative. Other: None. IMPRESSION: Acute LEFT MCA territory infarct, nonhemorrhagic. Atrophy and small vessel disease with a remote LEFT MCA territory infarct, immediately adjacent. These results were called by telephone at the time of interpretation on 11/18/2018 at 4:18 pm to Dr. Gertie Exon assistant who verbally acknowledged these results. Electronically Signed   By: Staci Righter M.D.   On: 11/18/2018 16:24     IMPRESSION AND PLAN:   *Acute left MCA territory CVA.  Symptoms have improved well.  At this point patient will be admitted with stroke order set to medical floor with telemetry monitoring and neuro checks.  PT OT consulted.  Aspirin, statin.  Consulted neurology.  Carotid Dopplers and echocardiogram ordered  *Hypertension.  Continue home medications.  More than 48 hours since stroke.  *Diabetes mellitus.  Metformin held.  Sliding scale insulin.  *DVT prophylaxis with Lovenox  All the records are reviewed and case discussed  with ED provider. Management plans discussed with the patient, family and they are in agreement.  CODE STATUS: DNR/DNI  TOTAL TIME TAKING CARE OF THIS PATIENT: 40 minutes.   Orie FishermanSrikar R Deny Chevez M.D on 11/18/2018 at 7:47 PM  Between 7am to 6pm - Pager - [phone]  After 6pm go to [email or web address] - password [email or web address] [facility]  [facility] [place]  [facility]  Office  [phone]  CC: Primary care physician; Patrice ParadiseMcLaughlin, Miriam K, MD  Note: This dictation was prepared with Dragon dictation along with smaller phrase technology. Any transcriptional errors that result from this process are unintentional.

## 2018-11-18 NOTE — ED Provider Notes (Signed)
[facility] Emergency Department Provider Note  Time seen: 4:54 PM  I have reviewed the triage vital signs and the nursing notes.   HISTORY  Chief Complaint Dizziness and Aphasia   HPI Kara Rasmussen is a 73 y.o. female with a past medical history of diabetes, gastric reflux, hypertension, hyperlipidemia, prior CVA in 1997 presents to the emergency department with a likely CVA.  According to the patient on Saturday she developed weakness and noticed that her speech was slowed.  Patient saw her primary care doctor today who ordered an MRI which showed an acute infarct in her left MCA.  Patient states she had a prior infarct and 1997, but no residual deficits.  Patient takes an 81 mg aspirin daily.  No chest pain shortness of breath or fever.  Has had a mild cough per patient.  Largely negative review of systems otherwise.  Patient has not noticed any obvious weakness or numbness of any extremity but has noted a slight right facial droop.   Past Medical History:  Diagnosis Date  . Diabetes mellitus without complication (HCC)    Type II  . GERD (gastroesophageal reflux disease)   . Hypercholesterolemia   . Hypertension     Patient Active Problem List   Diagnosis Date Noted  . Post-menopausal bleeding 03/03/2015    History reviewed. No pertinent surgical history.  Prior to Admission medications   Medication Sig Start Date End Date Taking? Authorizing Provider  aspirin 325 MG tablet Take 325 mg by mouth.    [provider]  folic acid (FOLVITE) 1 MG tablet Take 1 mg by mouth daily.     [provider]  lisinopril-hydrochlorothiazide (PRINZIDE,ZESTORETIC) 10-12.5 MG tablet Take 1 tablet by mouth daily.     [provider]  metFORMIN (GLUCOPHAGE) 1000 MG tablet Take 1,000 mg by mouth 2 (two) times daily.     [provider]  omeprazole (PRILOSEC) 40 MG capsule Take 40 mg by mouth daily.     [provider]  ondansetron  (ZOFRAN) 4 MG tablet Take 1 tablet (4 mg total) by mouth every 8 (eight) hours as needed for nausea or vomiting. 02/09/18   Governor RooksLord, Rebecca, MD  pravastatin (PRAVACHOL) 10 MG tablet Take 40 mg by mouth daily.     [provider]    Allergies  Allergen Reactions  . Procaine Hives    Passed out    Family History  Problem Relation Age of Onset  . Breast cancer Sister 8030    Social History Social History   Tobacco Use  . Smoking status: Former Games developermoker  . Smokeless tobacco: Never Used  Substance Use Topics  . Alcohol use: Not on file  . Drug use: Not on file    Review of Systems Constitutional: Negative for fever.  Generalized weakness. Cardiovascular: Negative for chest pain. Respiratory: Negative for shortness of breath. Gastrointestinal: Negative for abdominal pain, vomiting and diarrhea. Musculoskeletal: Negative for musculoskeletal complaints Skin: Negative for skin complaints  Neurological: Negative for headache.  Slurred speech.  Right facial droop. All other ROS negative  ____________________________________________   PHYSICAL EXAM:  VITAL SIGNS: ED Triage Vitals  Enc Vitals Group     BP 11/18/18 1639 (!) 147/99     Pulse Rate 11/18/18 1639 89     Resp 11/18/18 1639 18     Temp 11/18/18 1639 97.8 F (36.6 C)     Temp src --      SpO2 11/18/18 1639 100 %  Weight 11/18/18 1639 205 lb (93 kg)     Height 11/18/18 1639 5\' 8"  (1.727 m)     Head Circumference --      Peak Flow --      Pain Score 11/18/18 1643 0     Pain Loc --      Pain Edu? --      Excl. in [place]? --     Constitutional: Alert and oriented. Well appearing and in no distress. Eyes: Normal exam ENT      Head: Normocephalic and atraumatic.      Mouth/Throat: Mucous membranes are moist. Cardiovascular: Normal rate, regular rhythm. No murmur Respiratory: Normal respiratory effort without tachypnea nor retractions. Breath sounds are clear  Gastrointestinal: Soft and nontender. No  distention. Musculoskeletal: Nontender with normal range of motion in all extremities. Neurologic:  Normal speech and language. No gross focal neurologic deficits  Skin:  Skin is warm, dry and intact.  Psychiatric: Mood and affect are normal.   ____________________________________________    EKG  EKG viewed and interpreted by myself shows a normal sinus rhythm at 79 bpm with a narrow QRS, left axis deviation, largely normal intervals, nonspecific ST changes.  ____________________________________________   INITIAL IMPRESSION / ASSESSMENT AND PLAN / ED COURSE  Pertinent labs & imaging results that were available during my care of the patient were reviewed by me and considered in my medical decision making (see chart for details).   Patient presents emergency department for generalized weakness slurred speech and right facial droop since Saturday.  Patient saw her PCP today who ordered an outpatient MRI.  I reviewed the MRI showing left MCA territory stroke.  On physical exam patient has a slight right upper extremity pronator drift, mild right facial droop.  Sensation intact and equal, slightly slurred speech.  We will check labs, EKG, test for coronavirus and plan to admit to the hospitalist service for further work-up and neurology consultation.  Patient's lab work is largely within normal limits.  EKG shows a sinus rhythm.  MRI confirms left MCA territory stroke.  Patient will be admitted to the hospital service for further treatment.  [name] was evaluated in Emergency Department on 11/18/2018 for the symptoms described in the history of present illness. She was evaluated in the context of the global COVID-19 pandemic, which necessitated consideration that the patient might be at risk for infection with the SARS-CoV-2 virus that causes COVID-19. Institutional protocols and algorithms that pertain to the evaluation of patients at risk for COVID-19 are in a state of rapid change based  on information released by regulatory bodies including the CDC and federal and state organizations. These policies and algorithms were followed during the patient's care in the ED.  NIH Stroke Scale   Interval: Baseline Time: 4:58 PM Person Administering Scale: [place]  Administer stroke scale items in the order listed. Record performance in each category after each subscale exam. Do not go back and change scores. Follow directions provided for each exam technique. Scores should reflect what the patient does, not what the clinician thinks the patient can do. The clinician should record answers while administering the exam and work quickly. Except where indicated, the patient should not be coached (i.e., repeated requests to patient to make a special effort).   1a  Level of consciousness: 0=alert; keenly responsive  1b. LOC questions:  0=Performs both tasks correctly  1c. LOC commands: 0=Performs both tasks correctly  2.  Best Gaze: 0=normal  3.  Visual: 0=No  visual loss  4. Facial Palsy: 1=Minor paralysis (flattened nasolabial fold, asymmetric on smiling)  5a.  Motor left arm: 0=No drift, limb holds 90 (or 45) degrees for full 10 seconds  5b.  Motor right arm: 1=Drift, limb holds 90 (or 45) degrees but drifts down before full 10 seconds: does not hit bed  6a. motor left leg: 0=No drift, limb holds 90 (or 45) degrees for full 10 seconds  6b  Motor right leg:  0=No drift, limb holds 90 (or 45) degrees for full 10 seconds  7. Limb Ataxia: 0=Absent  8.  Sensory: 0=Normal; no sensory loss  9. Best Language:  1=Mild to moderate aphasia; some obvious loss of fluency or facility of comprehension without significant limitation on ideas expressed or form of expression.  10. Dysarthria: 0=Normal  11. Extinction and Inattention: 0=No abnormality  12. Distal motor function: 0=Normal   Total:   3    ____________________________________________   FINAL CLINICAL IMPRESSION(S) / ED  DIAGNOSES  Acute CVA   Minna AntisPaduchowski, Leland Raver, MD 11/18/18 1820

## 2018-11-18 NOTE — ED Triage Notes (Signed)
dizziness and slurred speech x 2 days. Slight R facial droop and R grip weakness.

## 2018-11-18 NOTE — Progress Notes (Signed)
Advance care planning  Purpose of Encounter Acute CVA  Parties in Attendance Patient  Patients Decisional capacity Alert and oriented.  Able to make medical decisions.  No document healthcare power of attorney.  She wants her son MCSPADDEN,RICKEY L to make healthcare decisions if she is unable to at any point.  Discussed in detail regarding acute CVA.  Treatment plan , prognosis discussed.  All questions answered.  CODE STATUS discussed and patient wishes to be DNR/DNI  Orders entered and CODE STATUS changed  DNR/DNI  Time spent - 17 minutes

## 2018-11-18 NOTE — ED Notes (Signed)
ED TO INPATIENT HANDOFF REPORT  ED Nurse Name and Phone #:  Reuel BoomDaniel  [phone]  S Name/Age/Gender Kara LarkKaren Rasmussen 73 y.o. female Room/Bed: ED25A/ED25A  Code Status   Code Status: DNR  Home/SNF/Other Home Patient oriented to: self, place, time and situation Is this baseline? Yes   Triage Complete: Triage complete  Chief Complaint Dizziness; Loss of Speech  Triage Note dizziness and slurred speech x 2 days. Slight R facial droop and R grip weakness.    Allergies Allergies  Allergen Reactions  . Procaine Hives    Passed out    Level of Care/Admitting Diagnosis ED Disposition    ED Disposition Condition Comment   Admit  Hospital Area: [facility] [[id number]]  Level of Care: Med-Surg [16]  Covid Evaluation: N/A  Diagnosis: CVA (cerebral vascular accident) [facility](HCC) [[id number]][[id number]]  Admitting Physician: Milagros LollSUDINI, SRIKAR [[id number]]  Attending Physician: Milagros LollSUDINI, SRIKAR [[id number]]  PT Class (Do Not Modify): Observation [104]  PT Acc Code (Do Not Modify): Observation [10022]       B Medical/Surgery History Past Medical History:  Diagnosis Date  . Diabetes mellitus without complication (HCC)    Type II  . GERD (gastroesophageal reflux disease)   . Hypercholesterolemia   . Hypertension    History reviewed. No pertinent surgical history.   A IV Location/Drains/Wounds Patient Lines/Drains/Airways Status   Active Line/Drains/Airways    Name:   Placement date:   Placement time:   Site:   Days:   Peripheral IV 11/18/18 Left Forearm   11/18/18    1710    Forearm   less than 1          Intake/Output Last 24 hours No intake or output data in the 24 hours ending 11/18/18 2018  Labs/Imaging Results for orders placed or performed during the hospital encounter of 11/18/18 (from the past 48 hour(s))  CBC     Status: Abnormal   Collection Time: 11/18/18  5:04 PM  Result Value Ref Range   WBC 7.5 4.0 - 10.5 K/uL   RBC 5.35 (H) 3.87 - 5.11 MIL/uL   Hemoglobin  11.4 (L) 12.0 - 15.0 g/dL   HCT 65.737.4 84.636.0 - 96.246.0 %   MCV 69.9 (L) 80.0 - 100.0 fL   MCH 21.3 (L) 26.0 - 34.0 pg   MCHC 30.5 30.0 - 36.0 g/dL   RDW 95.217.2 (H) 84.111.5 - 32.415.5 %   Platelets 342 150 - 400 K/uL   nRBC 0.0 0.0 - 0.2 %    Comment: Performed at [facility], [address] [place], [place] [address]  Comprehensive metabolic panel     Status: Abnormal   Collection Time: 11/18/18  5:04 PM  Result Value Ref Range   Sodium 141 135 - 145 mmol/L   Potassium 3.6 3.5 - 5.1 mmol/L   Chloride 105 98 - 111 mmol/L   CO2 23 22 - 32 mmol/L   Glucose, Bld 102 (H) 70 - 99 mg/dL   BUN 8 8 - 23 mg/dL   Creatinine, Ser 7.250.84 0.44 - 1.00 mg/dL   Calcium 9.4 8.9 - 36.610.3 mg/dL   Total Protein 7.3 6.5 - 8.1 g/dL   Albumin 4.4 3.5 - 5.0 g/dL   AST 19 15 - 41 U/L   ALT 18 0 - 44 U/L   Alkaline Phosphatase 100 38 - 126 U/L   Total Bilirubin 0.3 0.3 - 1.2 mg/dL   GFR calc non Af Amer >60 >60 mL/min   GFR calc Af  Amer >60 >60 mL/min   Anion gap 13 5 - 15    Comment: Performed at [facility], [address] [place], [place] [address]  Protime-INR     Status: None   Collection Time: 11/18/18  5:04 PM  Result Value Ref Range   Prothrombin Time 12.9 11.4 - 15.2 seconds   INR 1.0 0.8 - 1.2    Comment: (NOTE) INR goal varies based on device and disease states. Performed at [facility], [address] [place], [place] [address]   Troponin I - ONCE - STAT     Status: None   Collection Time: 11/18/18  5:04 PM  Result Value Ref Range   Troponin I <0.03 <0.03 ng/mL    Comment: Performed at [facility], [address] [place], [place] [address]  SARS Coronavirus 2 (CEPHEID - Performed in [facility] hospital lab), Hosp Order     Status: None   Collection Time: 11/18/18  5:04 PM   Specimen: Nasopharyngeal Swab  Result Value Ref Range   SARS Coronavirus 2 NEGATIVE NEGATIVE    Comment: (NOTE) If result is NEGATIVE SARS-CoV-2 target nucleic acids are NOT  DETECTED. The SARS-CoV-2 RNA is generally detectable in upper and lower  respiratory specimens during the acute phase of infection. The lowest  concentration of SARS-CoV-2 viral copies this assay can detect is 250  copies / mL. A negative result does not preclude SARS-CoV-2 infection  and should not be used as the sole basis for treatment or other  patient management decisions.  A negative result may occur with  improper specimen collection / handling, submission of specimen other  than nasopharyngeal swab, presence of viral mutation(s) within the  areas targeted by this assay, and inadequate number of viral copies  (<250 copies / mL). A negative result must be combined with clinical  observations, patient history, and epidemiological information. If result is POSITIVE SARS-CoV-2 target nucleic acids are DETECTED. The SARS-CoV-2 RNA is generally detectable in upper and lower  respiratory specimens dur ing the acute phase of infection.  Positive  results are indicative of active infection with SARS-CoV-2.  Clinical  correlation with patient history and other diagnostic information is  necessary to determine patient infection status.  Positive results do  not rule out bacterial infection or co-infection with other viruses. If result is PRESUMPTIVE POSTIVE SARS-CoV-2 nucleic acids MAY BE PRESENT.   A presumptive positive result was obtained on the submitted specimen  and confirmed on repeat testing.  While 2019 novel coronavirus  (SARS-CoV-2) nucleic acids may be present in the submitted sample  additional confirmatory testing may be necessary for epidemiological  and / or clinical management purposes  to differentiate between  SARS-CoV-2 and other Sarbecovirus currently known to infect humans.  If clinically indicated additional testing with an alternate test  methodology [phone] is advised. The SARS-CoV-2 RNA is generally  detectable in upper and lower respiratory sp ecimens during  the acute  phase of infection. The expected result is Negative. Fact Sheet for Patients:  BoilerBrush.com.cyhttps://www.fda.gov/media/136312/download Fact Sheet for Healthcare Providers: https://pope.com/https://www.fda.gov/media/136313/download This test is not yet approved or cleared by the [place] FDA and has been authorized for detection and/or diagnosis of SARS-CoV-2 by FDA under an Emergency Use Authorization (EUA).  This EUA will remain in effect (meaning this test can be used) for the duration of the COVID-19 declaration under Section 564(b)(1) of the Act, 21 U.S.C. section 360bbb-3(b)(1), unless the authorization is terminated or revoked sooner. Performed at  [address] [place], [place] [address]    Mr Brain PX Contrast  Result Date: 11/18/2018 CLINICAL DATA:  Mild mental slowing with slurred speech. Cognitive change. EXAM: MRI HEAD WITHOUT CONTRAST TECHNIQUE: Multiplanar, multiecho pulse sequences of the brain and surrounding structures were obtained without intravenous contrast. COMPARISON:  02/09/2018 MRI FINDINGS: Brain: There is an acute LEFT MCA territory infarct affecting the lateral lentiform nucleus and periventricular white matter, lenticulostriate artery territory. This is adjacent to, and slightly posterior and lateral to an old infarct in roughly the same distribution. Chronic ex vacuo enlargement LEFT lateral ventricle. Mild cerebral and cerebellar atrophy. Mild subcortical and periventricular T2 and FLAIR hyperintensities, likely chronic microvascular ischemic change. Regional gliosis of the white matter accompanies the old infarct and ex vacuo ventricular enlargement. Vascular: Normal flow voids. Specifically, the LEFT ICA and LEFT MCA appear normal. Skull and upper cervical spine: Normal marrow signal. Sinuses/Orbits: Negative. Other: None. IMPRESSION: Acute LEFT MCA territory infarct, nonhemorrhagic. Atrophy and small vessel disease with a remote LEFT MCA territory  infarct, immediately adjacent. These results were called by telephone at the time of interpretation on 11/18/2018 at 4:18 pm to Dr. Gertie Exon assistant who verbally acknowledged these results. Electronically Signed   By: Staci Righter M.D.   On: 11/18/2018 16:24    Pending Labs Unresulted Labs (From admission, onward)    Start     Ordered   11/25/18 0500  Creatinine, serum  (enoxaparin (LOVENOX)    CrCl >/= 30 ml/min)  Weekly,   STAT    Comments: while on enoxaparin therapy    11/18/18 1944   11/19/18 0500  Lipid panel  Tomorrow morning,   STAT    Comments: Fasting    11/18/18 1944   11/18/18 1942  Hemoglobin A1c  Add-on,   AD     11/18/18 1944   11/18/18 1941  HIV antibody (Routine Testing)  Add-on,   AD     11/18/18 1944          Vitals/Pain Today's Vitals   11/18/18 1639 11/18/18 1643 11/18/18 1912  BP: (!) 147/99  (!) 158/78  Pulse: 89  80  Resp: 18  (!) 26  Temp: 97.8 F (36.6 C)    SpO2: 100%  100%  Weight: 93 kg    Height: 5\' 8"  (1.727 m)    PainSc:  0-No pain 0-No pain    Isolation Precautions No active isolations  Medications Medications   stroke: mapping our early stages of recovery book (has no administration in time range)  acetaminophen (TYLENOL) tablet 650 mg (has no administration in time range)    Or  acetaminophen (TYLENOL) solution 650 mg (has no administration in time range)    Or  acetaminophen (TYLENOL) suppository 650 mg (has no administration in time range)  enoxaparin (LOVENOX) injection 40 mg (has no administration in time range)  insulin aspart (novoLOG) injection 0-9 Units (has no administration in time range)  ondansetron (ZOFRAN) injection 4 mg (has no administration in time range)    Mobility walks Low fall risk   Focused Assessments Neuro Assessment Handoff:  Swallow screen pass? Yes    NIH Stroke Scale ( + Modified Stroke Scale Criteria)  Level of Consciousness (1a.)   : Alert, keenly responsive LOC Questions (1b. )   +:  Answers both questions correctly LOC Commands (1c. )   + : Performs both tasks correctly Best Gaze (2. )  +: Normal Visual (3. )  +: No visual loss  Facial Palsy (4. )    : Normal symmetrical movements Motor Arm, Left (5a. )   +: No drift Motor Arm, Right (5b. )   +: No drift Motor Leg, Left (6a. )   +: No drift Motor Leg, Right (6b. )   +: No drift Limb Ataxia (7. ): Absent Sensory (8. )   +: Normal, no sensory loss Last date known well: 11/16/18 Last time known well: 1059 Neuro Assessment:   Neuro Checks:      Last Documented NIHSS Modified Score:   Has TPA been given? No If patient is a Neuro Trauma and patient is going to OR before floor call report to 4N Charge nurse: [phone] or [phone]     R Recommendations: See Admitting Provider Note  Report given to:   Additional Notes:

## 2018-11-19 ENCOUNTER — Inpatient Hospital Stay: Payer: Medicare Other

## 2018-11-19 ENCOUNTER — Inpatient Hospital Stay (HOSPITAL_COMMUNITY)
Admit: 2018-11-19 | Discharge: 2018-11-19 | Disposition: A | Discharge status: Home / Self Care | Payer: Medicare Other | Attending: Internal Medicine | Admitting: Internal Medicine

## 2018-11-19 DIAGNOSIS — Z7982 Long term (current) use of aspirin: Secondary | ICD-10-CM | POA: Diagnosis not present

## 2018-11-19 DIAGNOSIS — Z87891 Personal history of nicotine dependence: Secondary | ICD-10-CM | POA: Diagnosis not present

## 2018-11-19 DIAGNOSIS — K219 Gastro-esophageal reflux disease without esophagitis: Secondary | ICD-10-CM | POA: Diagnosis present

## 2018-11-19 DIAGNOSIS — R29703 NIHSS score 3: Secondary | ICD-10-CM | POA: Diagnosis present

## 2018-11-19 DIAGNOSIS — Z1159 Encounter for screening for other viral diseases: Secondary | ICD-10-CM | POA: Diagnosis not present

## 2018-11-19 DIAGNOSIS — G459 Transient cerebral ischemic attack, unspecified: Secondary | ICD-10-CM

## 2018-11-19 DIAGNOSIS — E1151 Type 2 diabetes mellitus with diabetic peripheral angiopathy without gangrene: Secondary | ICD-10-CM | POA: Diagnosis present

## 2018-11-19 DIAGNOSIS — E7800 Pure hypercholesterolemia, unspecified: Secondary | ICD-10-CM | POA: Diagnosis present

## 2018-11-19 DIAGNOSIS — Z803 Family history of malignant neoplasm of breast: Secondary | ICD-10-CM | POA: Diagnosis not present

## 2018-11-19 DIAGNOSIS — R4701 Aphasia: Secondary | ICD-10-CM | POA: Diagnosis present

## 2018-11-19 DIAGNOSIS — E785 Hyperlipidemia, unspecified: Secondary | ICD-10-CM | POA: Diagnosis present

## 2018-11-19 DIAGNOSIS — R42 Dizziness and giddiness: Secondary | ICD-10-CM | POA: Diagnosis present

## 2018-11-19 DIAGNOSIS — I10 Essential (primary) hypertension: Secondary | ICD-10-CM | POA: Diagnosis present

## 2018-11-19 DIAGNOSIS — Z79899 Other long term (current) drug therapy: Secondary | ICD-10-CM | POA: Diagnosis not present

## 2018-11-19 DIAGNOSIS — R29810 Facial weakness: Secondary | ICD-10-CM | POA: Diagnosis present

## 2018-11-19 DIAGNOSIS — Z888 Allergy status to other drugs, medicaments and biological substances status: Secondary | ICD-10-CM | POA: Diagnosis not present

## 2018-11-19 DIAGNOSIS — I63512 Cerebral infarction due to unspecified occlusion or stenosis of left middle cerebral artery: Secondary | ICD-10-CM | POA: Diagnosis present

## 2018-11-19 DIAGNOSIS — Z7984 Long term (current) use of oral hypoglycemic drugs: Secondary | ICD-10-CM | POA: Diagnosis not present

## 2018-11-19 DIAGNOSIS — Z66 Do not resuscitate: Secondary | ICD-10-CM | POA: Diagnosis present

## 2018-11-19 LAB — GLUCOSE, CAPILLARY
Glucose-Capillary: 126 mg/dL — ABNORMAL HIGH (ref 70–99)
Glucose-Capillary: 149 mg/dL — ABNORMAL HIGH (ref 70–99)
Glucose-Capillary: 198 mg/dL — ABNORMAL HIGH (ref 70–99)
Glucose-Capillary: 136 mg/dL — ABNORMAL HIGH (ref 70–99)

## 2018-11-19 LAB — HEMOGLOBIN A1C
Hgb A1c MFr Bld: 7.2 % — ABNORMAL HIGH (ref 4.8–5.6)
Mean Plasma Glucose: 159.94 mg/dL

## 2018-11-19 LAB — LIPID PANEL
Cholesterol: 154 mg/dL (ref 0–200)
HDL: 35 mg/dL — ABNORMAL LOW (ref >40)
LDL Cholesterol: 92 mg/dL (ref 0–99)
Total CHOL/HDL Ratio: 4.4 RATIO
Triglycerides: 134 mg/dL (ref <150)
VLDL: 27 mg/dL (ref 0–40)

## 2018-11-19 LAB — ECHOCARDIOGRAM COMPLETE
Height: 68 in
Weight: 3269.86 oz

## 2018-11-19 MED ORDER — ASPIRIN EC 81 MG PO TBEC: 162.0000 mg | DELAYED_RELEASE_TABLET | Freq: Every day | ORAL | Status: DC

## 2018-11-19 MED ORDER — ASPIRIN EC 325 MG PO TBEC
325.0000 mg | DELAYED_RELEASE_TABLET | Freq: Every day | ORAL | Status: DC
  Administered 2018-11-20: 325 mg via ORAL
  Filled 2018-11-19: qty 1

## 2018-11-19 NOTE — Progress Notes (Signed)
[facility] [place] NAME: Kara Rasmussen    MR#:  [id number]  DATE OF BIRTH:  Oct 14, 1945  SUBJECTIVE:  CHIEF COMPLAINT:   Chief Complaint  Patient presents with  . Dizziness  . Aphasia  Patient seen today Speech better No tingling or numbness  REVIEW OF SYSTEMS:    ROS  CONSTITUTIONAL: No documented fever. No fatigue, weakness. No weight gain, no weight loss.  EYES: No blurry or double vision.  ENT: No tinnitus. No postnasal drip. No redness of the oropharynx.  RESPIRATORY: No cough, no wheeze, no hemoptysis. No dyspnea.  CARDIOVASCULAR: No chest pain. No orthopnea. No palpitations. No syncope.  GASTROINTESTINAL: No nausea, no vomiting or diarrhea. No abdominal pain. No melena or hematochezia.  GENITOURINARY: No dysuria or hematuria.  ENDOCRINE: No polyuria or nocturia. No heat or cold intolerance.  HEMATOLOGY: No anemia. No bruising. No bleeding.  INTEGUMENTARY: No rashes. No lesions.  MUSCULOSKELETAL: No arthritis. No swelling. No gout.  NEUROLOGIC: No numbness, tingling, or ataxia. No seizure-type activity. Dysphasia noted PSYCHIATRIC: No anxiety. No insomnia. No ADD.   DRUG ALLERGIES:   Allergies  Allergen Reactions  . Procaine Hives    Passed out    VITALS:  Blood pressure (!) 147/79, pulse 79, temperature 98.6 F (37 C), temperature source Oral, resp. rate 16, height 5\' 8"  (1.727 m), weight 92.7 kg, SpO2 98 %.  PHYSICAL EXAMINATION:   Physical Exam  GENERAL:  73 y.o.-year-old patient lying in the bed with no acute distress.  EYES: Pupils equal, round, reactive to light and accommodation. No scleral icterus. Extraocular muscles intact.  HEENT: Head atraumatic, normocephalic. Oropharynx and nasopharynx clear.  NECK:  Supple, no jugular venous distention. No thyroid enlargement, no tenderness.  LUNGS: Normal breath sounds bilaterally, no wheezing, rales, rhonchi. No use of accessory muscles of respiration.   CARDIOVASCULAR: S1, S2 normal. No murmurs, rubs, or gallops.  ABDOMEN: Soft, nontender, nondistended. Bowel sounds present. No organomegaly or mass.  EXTREMITIES: No cyanosis, clubbing or edema b/l.    NEUROLOGIC: Cranial nerves II through XII are intact. No focal Motor or sensory deficits b/l.   PSYCHIATRIC: The patient is alert and oriented x 3.  SKIN: No obvious rash, lesion, or ulcer.   LABORATORY PANEL:   CBC Recent Labs  Lab 11/18/18 1704  WBC 7.5  HGB 11.4*  HCT 37.4  PLT 342   ------------------------------------------------------------------------------------------------------------------ Chemistries  Recent Labs  Lab 11/18/18 1704  NA 141  K 3.6  CL 105  CO2 23  GLUCOSE 102*  BUN 8  CREATININE 0.84  CALCIUM 9.4  AST 19  ALT 18  ALKPHOS 100  BILITOT 0.3   ------------------------------------------------------------------------------------------------------------------  Cardiac Enzymes Recent Labs  Lab 11/18/18 1704  TROPONINI <0.03   ------------------------------------------------------------------------------------------------------------------  RADIOLOGY:  Mr Brain Wo Contrast  Result Date: 11/18/2018 CLINICAL DATA:  Mild mental slowing with slurred speech. Cognitive change. EXAM: MRI HEAD WITHOUT CONTRAST TECHNIQUE: Multiplanar, multiecho pulse sequences of the brain and surrounding structures were obtained without intravenous contrast. COMPARISON:  02/09/2018 MRI FINDINGS: Brain: There is an acute LEFT MCA territory infarct affecting the lateral lentiform nucleus and periventricular white matter, lenticulostriate artery territory. This is adjacent to, and slightly posterior and lateral to an old infarct in roughly the same distribution. Chronic ex vacuo enlargement LEFT lateral ventricle. Mild cerebral and cerebellar atrophy. Mild subcortical and periventricular T2 and FLAIR hyperintensities, likely chronic microvascular ischemic change. Regional  gliosis of the white matter accompanies the old infarct and  ex vacuo ventricular enlargement. Vascular: Normal flow voids. Specifically, the LEFT ICA and LEFT MCA appear normal. Skull and upper cervical spine: Normal marrow signal. Sinuses/Orbits: Negative. Other: None. IMPRESSION: Acute LEFT MCA territory infarct, nonhemorrhagic. Atrophy and small vessel disease with a remote LEFT MCA territory infarct, immediately adjacent. These results were called by telephone at the time of interpretation on 11/18/2018 at 4:18 pm to Dr. Hosie PoissonWhitaker's assistant who verbally acknowledged these results. Electronically Signed   By: Elsie StainJohn T Curnes M.D.   On: 11/18/2018 16:24   [place] Carotid Bilateral (at [facility] And [facility] Only)  Result Date: 11/19/2018 CLINICAL DATA:  TIA symptoms, hypertension, hyperlipidemia, diabetes EXAM: BILATERAL CAROTID DUPLEX ULTRASOUND TECHNIQUE: Wallace CullensGray scale imaging, color Doppler and duplex ultrasound were performed of bilateral carotid and vertebral arteries in the neck. COMPARISON:  None. FINDINGS: Criteria: Quantification of carotid stenosis is based on velocity parameters that correlate the residual internal carotid diameter with NASCET-based stenosis levels, using the diameter of the distal internal carotid lumen as the denominator for stenosis measurement. The following velocity measurements were obtained: RIGHT ICA: 122/27 cm/sec CCA: 101/21 cm/sec SYSTOLIC ICA/CCA RATIO:  1.2 ECA: 112 cm/sec LEFT ICA: 154/47 cm/sec CCA: 82/13 cm/sec SYSTOLIC ICA/CCA RATIO:  1.9 ECA: 68 cm/sec RIGHT CAROTID ARTERY: Minor echogenic shadowing plaque formation. No hemodynamically significant right ICA stenosis, velocity elevation, or turbulent flow. Degree of narrowing less than 50%. RIGHT VERTEBRAL ARTERY:  Antegrade LEFT CAROTID ARTERY: Similar scattered minor echogenic plaque formation. No hemodynamically significant left ICA stenosis, velocity elevation, or turbulent flow. LEFT VERTEBRAL ARTERY:  Antegrade IMPRESSION: Minor  carotid atherosclerosis. No hemodynamically significant ICA stenosis. Degree of narrowing less than 50% bilaterally by ultrasound criteria. Patent antegrade vertebral flow bilaterally Electronically Signed   By: Judie PetitM.  Shick M.D.   On: 11/19/2018 09:32     ASSESSMENT AND PLAN:  73 yr old female patient with history of cva, hyperlipidemia,hypertension, GERD under hospitalist service.  -Acute Left MCA CVA S/p Neurology f/u Continue Aspirin and statin Echo and carotid USD reviewed PT and speech therapy   -Hypertension Medical mgmt  -DVT prophylaxis with [place] lovenox daily Medical mgmt   All the records are reviewed and case discussed with Care Management/Social Worker. Management plans discussed with the patient, family and they are in agreement.  CODE STATUS: Full code  DVT Prophylaxis: SCDs  TOTAL TIME TAKING CARE OF THIS PATIENT: 38 minutes.   POSSIBLE D/C IN 1 DAYS, DEPENDING ON CLINICAL CONDITION.  Ihor AustinPavan Teia Freitas M.D on 11/19/2018 at 5:46 PM  Between 7am to 6pm - Pager - [phone]  After 6pm go to [email or web address] - password [email or web address] [facility]  [facility]  Office  [phone]  CC: Primary care physician; Patrice ParadiseMcLaughlin, Miriam K, MD  Note: This dictation was prepared with Dragon dictation along with smaller phrase technology. Any transcriptional errors that result from this process are unintentional.

## 2018-11-19 NOTE — Consult Note (Addendum)
Chief Complaint: lighheadnessslurred speech HPI: Kara LarkKaren Rasmussen is an 73 y.o. female  Admitted with slurred speech/lighheadeness  73 y.o. 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  Patient state she felt lightheaded and her speech was slurred on Saturday before admission. States that her lightheadeness and slurred speech improved over the week end. Daughter asked her to seek medical attention on Monday. Patient found to have right facial droop in ER. Head ct did not show any acute abnlities. Mri done during admission shoed a left MCA stroke. Echo results pending. No HA. No nausea/no vitng. No visual changes, no chest pain.   Past Medical History:  Diagnosis Date  . Diabetes mellitus without complication (HCC)    Type II  . GERD (gastroesophageal reflux disease)   . Hypercholesterolemia   . Hypertension     History reviewed. No pertinent surgical history.  Family History  Problem Relation Age of Onset  . Breast cancer Sister 7330   Social History:  reports that she has quit smoking. She has never used smokeless tobacco. She reports that she does not drink alcohol or use drugs.  Allergies:  Allergies  Allergen Reactions  . Procaine Hives    Passed out    Medications Prior to Admission  Medication Sig Dispense Refill  . aspirin EC 81 MG tablet Take 81 mg by mouth daily.    . Cannabidiol 100 MG/ML SOLN Take 100 mg by mouth as directed.    . folic acid (FOLVITE) 1 MG tablet Take 1 mg by mouth daily.     Marland Kitchen. lisinopril-hydrochlorothiazide (ZESTORETIC) 10-12.5 MG tablet Take 1 tablet by mouth daily.    . metFORMIN (GLUCOPHAGE) 1000 MG tablet Take 1,000 mg by mouth 2 (two) times daily.    Marland Kitchen. omeprazole (PRILOSEC) 40 MG capsule Take 40 mg by mouth daily.    Marland Kitchen. PRAVACHOL 40 MG tablet Take 40 mg by mouth daily.      ROS:   Physical  Examination: Blood pressure (!) 147/79, pulse 79, temperature 98.6 F (37 C), temperature source Oral, resp. rate 16, height 5\' 8"  (1.727 m), weight 92.7 kg, SpO2 98 %.  HEENT-  Normocephalic, no lesions, without obvious abnormality.  Normal external eye and conjunctiva.  Normal TM's bilaterally.  Normal auditory canals and external ears. Normal external nose, mucus membranes and septum.  Normal pharynx. Neck supple with no masses, nodes, nodules or enlargement. Cardiovascular - regular rate and rhythm, S1, S2 normal, no murmur, click, rub or gallop Lungs - chest clear, no wheezing, rales, normal symmetric air entry, Heart exam - S1, S2 normal, no murmur, no gallop, rate regular Abdomen - soft, non-tender; bowel sounds normal; no masses,  no organomegaly Extremities - no edema  Neurologic Examination: Patient alert, awake, oriented x3, speech is nle, follows commands CN: PERRLA, EOMI, no nystagmus, VFF, nasolabial fold flattening on the right ( could be body habitus, edentoulous), face sensation seems intact, palate and tongue midline. No motor deficit appreciated No sensory deficit appreciated DTR noty assessed No coordination deficit appreciated giat not assessed at time of examination  Results for orders placed or performed during the hospital encounter of 11/18/18 (from the past 48 hour(s))  CBC     Status: Abnormal   Collection Time: 11/18/18  5:04 PM  Result Value Ref Range   WBC 7.5 4.0 - 10.5 K/uL   RBC 5.35 (H) 3.87 -  5.11 MIL/uL   Hemoglobin 11.4 (L) 12.0 - 15.0 g/dL   HCT 37.4 36.0 - 46.0 %   MCV 69.9 (L) 80.0 - 100.0 fL   MCH 21.3 (L) 26.0 - 34.0 pg   MCHC 30.5 30.0 - 36.0 g/dL   RDW 17.2 (H) 11.5 - 15.5 %   Platelets 342 150 - 400 K/uL   nRBC 0.0 0.0 - 0.2 %    Comment: Performed at [facility], [address] [place], [place] [address]  Comprehensive metabolic panel     Status: Abnormal   Collection Time: 11/18/18  5:04 PM  Result Value Ref Range    Sodium 141 135 - 145 mmol/L   Potassium 3.6 3.5 - 5.1 mmol/L   Chloride 105 98 - 111 mmol/L   CO2 23 22 - 32 mmol/L   Glucose, Bld 102 (H) 70 - 99 mg/dL   BUN 8 8 - 23 mg/dL   Creatinine, Ser 0.84 0.44 - 1.00 mg/dL   Calcium 9.4 8.9 - 10.3 mg/dL   Total Protein 7.3 6.5 - 8.1 g/dL   Albumin 4.4 3.5 - 5.0 g/dL   AST 19 15 - 41 U/L   ALT 18 0 - 44 U/L   Alkaline Phosphatase 100 38 - 126 U/L   Total Bilirubin 0.3 0.3 - 1.2 mg/dL   GFR calc non Af Amer >60 >60 mL/min   GFR calc Af Amer >60 >60 mL/min   Anion gap 13 5 - 15    Comment: Performed at [facility], [address] [place], [place] [address]  Protime-INR     Status: None   Collection Time: 11/18/18  5:04 PM  Result Value Ref Range   Prothrombin Time 12.9 11.4 - 15.2 seconds   INR 1.0 0.8 - 1.2    Comment: (NOTE) INR goal varies based on device and disease states. Performed at [facility], [address] [place], [place] [address]   Troponin I - ONCE - STAT     Status: None   Collection Time: 11/18/18  5:04 PM  Result Value Ref Range   Troponin I <0.03 <0.03 ng/mL    Comment: Performed at [facility], [address] [place], [place] [address]  SARS Coronavirus 2 (CEPHEID - Performed in [facility] hospital lab), Hosp Order     Status: None   Collection Time: 11/18/18  5:04 PM   Specimen: Nasopharyngeal Swab  Result Value Ref Range   SARS Coronavirus 2 NEGATIVE NEGATIVE    Comment: (NOTE) If result is NEGATIVE SARS-CoV-2 target nucleic acids are NOT DETECTED. The SARS-CoV-2 RNA is generally detectable in upper and lower  respiratory specimens during the acute phase of infection. The lowest  concentration of SARS-CoV-2 viral copies this assay can detect is 250  copies / mL. A negative result does not preclude SARS-CoV-2 infection  and should not be used as the sole basis for treatment or other  patient management decisions.  A negative result may occur with  improper specimen  collection / handling, submission of specimen other  than nasopharyngeal swab, presence of viral mutation(s) within the  areas targeted by this assay, and inadequate number of viral copies  (<250 copies / mL). A negative result must be combined with clinical  observations, patient history, and epidemiological information. If result is POSITIVE SARS-CoV-2 target nucleic acids are DETECTED. The SARS-CoV-2 RNA is generally detectable in upper and lower  respiratory specimens dur ing the acute phase of infection.  Positive  results are indicative of active infection with SARS-CoV-2.  Clinical  correlation with patient history and other diagnostic information is  necessary to determine patient infection status.  Positive results do  not rule out bacterial infection or co-infection with other viruses. If result is PRESUMPTIVE POSTIVE SARS-CoV-2 nucleic acids MAY BE PRESENT.   A presumptive positive result was obtained on the submitted specimen  and confirmed on repeat testing.  While 2019 novel coronavirus  (SARS-CoV-2) nucleic acids may be present in the submitted sample  additional confirmatory testing may be necessary for epidemiological  and / or clinical management purposes  to differentiate between  SARS-CoV-2 and other Sarbecovirus currently known to infect humans.  If clinically indicated additional testing with an alternate test  methodology [phone] is advised. The SARS-CoV-2 RNA is generally  detectable in upper and lower respiratory sp ecimens during the acute  phase of infection. The expected result is Negative. Fact Sheet for Patients:  BoilerBrush.com.cyhttps://www.fda.gov/media/136312/download Fact Sheet for Healthcare Providers: https://pope.com/https://www.fda.gov/media/136313/download This test is not yet approved or cleared by the [place] FDA and has been authorized for detection and/or diagnosis of SARS-CoV-2 by FDA under an Emergency Use Authorization (EUA).  This EUA will remain in effect  (meaning this test can be used) for the duration of the COVID-19 declaration under Section 564(b)(1) of the Act, 21 U.S.C. section 360bbb-3(b)(1), unless the authorization is terminated or revoked sooner. Performed at [facility], [address] [place], [place] [address]   Hemoglobin A1c     Status: Abnormal   Collection Time: 11/18/18  5:04 PM  Result Value Ref Range   Hgb A1c MFr Bld 7.2 (H) 4.8 - 5.6 %    Comment: (NOTE) Pre diabetes:          5.7%-6.4% Diabetes:              >6.4% Glycemic control for   <7.0% adults with diabetes    Mean Plasma Glucose 159.94 mg/dL    Comment: Performed at [facility], [address] [place], [place] [address]  Glucose, capillary     Status: Abnormal   Collection Time: 11/18/18  9:12 PM  Result Value Ref Range   Glucose-Capillary 153 (H) 70 - 99 mg/dL  Lipid panel     Status: Abnormal   Collection Time: 11/19/18  3:47 AM  Result Value Ref Range   Cholesterol 154 0 - 200 mg/dL   Triglycerides 086134 <578<150 mg/dL   HDL 35 (L) >46>40 mg/dL   Total CHOL/HDL Ratio 4.4 RATIO   VLDL 27 0 - 40 mg/dL   LDL Cholesterol 92 0 - 99 mg/dL    Comment:        Total Cholesterol/HDL:CHD Risk Coronary Heart Disease Risk Table                     Men   Women  1/2 Average Risk   3.4   3.3  Average Risk       5.0   4.4  2 X Average Risk   9.6   7.1  3 X Average Risk  23.4   11.0        Use the calculated Patient Ratio above and the CHD Risk Table to determine the patient's CHD Risk.        ATP III CLASSIFICATION (LDL):  <100     mg/dL   Optimal  [phone]  mg/dL   Near or Above  Optimal  130-159  mg/dL   Borderline  [id number]  mg/dL   High  >045>190     mg/dL   Very High Performed at [facility], [address] [place], [place] [address]   Glucose, capillary     Status: Abnormal   Collection Time: 11/19/18  7:52 AM  Result Value Ref Range   Glucose-Capillary 126 (H) 70 - 99 mg/dL   Comment 1 Notify RN    Mr  Brain Wo Contrast  Result Date: 11/18/2018 CLINICAL DATA:  Mild mental slowing with slurred speech. Cognitive change. EXAM: MRI HEAD WITHOUT CONTRAST TECHNIQUE: Multiplanar, multiecho pulse sequences of the brain and surrounding structures were obtained without intravenous contrast. COMPARISON:  02/09/2018 MRI FINDINGS: Brain: There is an acute LEFT MCA territory infarct affecting the lateral lentiform nucleus and periventricular white matter, lenticulostriate artery territory. This is adjacent to, and slightly posterior and lateral to an old infarct in roughly the same distribution. Chronic ex vacuo enlargement LEFT lateral ventricle. Mild cerebral and cerebellar atrophy. Mild subcortical and periventricular T2 and FLAIR hyperintensities, likely chronic microvascular ischemic change. Regional gliosis of the white matter accompanies the old infarct and ex vacuo ventricular enlargement. Vascular: Normal flow voids. Specifically, the LEFT ICA and LEFT MCA appear normal. Skull and upper cervical spine: Normal marrow signal. Sinuses/Orbits: Negative. Other: None. IMPRESSION: Acute LEFT MCA territory infarct, nonhemorrhagic. Atrophy and small vessel disease with a remote LEFT MCA territory infarct, immediately adjacent. These results were called by telephone at the time of interpretation on 11/18/2018 at 4:18 pm to Dr. Hosie PoissonWhitaker's assistant who verbally acknowledged these results. Electronically Signed   By: Elsie StainJohn T Curnes M.D.   On: 11/18/2018 16:24   [place] Carotid Bilateral (at [facility] And [facility] Only)  Result Date: 11/19/2018 CLINICAL DATA:  TIA symptoms, hypertension, hyperlipidemia, diabetes EXAM: BILATERAL CAROTID DUPLEX ULTRASOUND TECHNIQUE: Wallace CullensGray scale imaging, color Doppler and duplex ultrasound were performed of bilateral carotid and vertebral arteries in the neck. COMPARISON:  None. FINDINGS: Criteria: Quantification of carotid stenosis is based on velocity parameters that correlate the residual internal carotid  diameter with NASCET-based stenosis levels, using the diameter of the distal internal carotid lumen as the denominator for stenosis measurement. The following velocity measurements were obtained: RIGHT ICA: 122/27 cm/sec CCA: 101/21 cm/sec SYSTOLIC ICA/CCA RATIO:  1.2 ECA: 112 cm/sec LEFT ICA: 154/47 cm/sec CCA: 82/13 cm/sec SYSTOLIC ICA/CCA RATIO:  1.9 ECA: 68 cm/sec RIGHT CAROTID ARTERY: Minor echogenic shadowing plaque formation. No hemodynamically significant right ICA stenosis, velocity elevation, or turbulent flow. Degree of narrowing less than 50%. RIGHT VERTEBRAL ARTERY:  Antegrade LEFT CAROTID ARTERY: Similar scattered minor echogenic plaque formation. No hemodynamically significant left ICA stenosis, velocity elevation, or turbulent flow. LEFT VERTEBRAL ARTERY:  Antegrade IMPRESSION: Minor carotid atherosclerosis. No hemodynamically significant ICA stenosis. Degree of narrowing less than 50% bilaterally by ultrasound criteria. Patent antegrade vertebral flow bilaterally Electronically Signed   By: Judie PetitM.  Shick M.D.   On: 11/19/2018 09:32    Assessment: 73 y.o. 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 Neuro exam with right nasolabial fold flattening. MRI with left MCA stroke.  Stroke Risk Factors - diabetes mellitus and hypertension  Plan: 1. HgbA1c, fasting lipid panel 3. PT consult, OT consult, Speech consult 4. Echocardiogram 5. Carotid dopplers 6. Prophylactic therapy-Antiplatelet med: Aspirin - dose 162mg  7. Risk factor modification: better control of HTN, DM 8- If  patient medically stable, can be discharged from neuro stand point with OTP f/up.

## 2018-11-19 NOTE — Evaluation (Signed)
Occupational Therapy Evaluation Patient Details Name: Kara Rasmussen MRN: [id number] DOB: 07/04/1945 Today's Date: 11/19/2018    History of Present Illness Pt. is a 73 y.o. female who was [facility] with slight R facial droop, dizziness, and aphasia.  MRI imaging revealed an acute L MCA infarct (non-hemorrhagic).  PMH includes DM, htn, h/o CVA Nov 1999.   Clinical Impression   Pt. reports being back to baseline with BUE, and ADL functioning. BUE strength, sensation, proprioceptive awareness, motor control, and [facility] skills are intact. No further OT services are warranted at this time, and follow-up OT services are not indicated. Will discharge OT services, and complete the order.    Follow Up Recommendations  No OT follow up    Equipment Recommendations    None   Recommendations for Other Services       Precautions / Restrictions Precautions Precautions: None Restrictions Weight Bearing Restrictions: No      Mobility Bed Mobility Overal bed mobility: Independent                Transfers Overall transfer level: Independent Equipment used: None             General transfer comment: steady safe transfers noted    Balance Overall balance assessment: Independent                               Standardized Balance Assessment Standardized Balance Assessment :          ADL either performed or assessed with clinical judgement   ADL Overall ADL's : Independent                                             Vision Baseline Vision/History: Wears glasses Wears Glasses: At all times Patient Visual Report: No change from baseline       Perception     Praxis      Pertinent Vitals/Pain Pain Assessment: No/denies pain     Hand Dominance Right   Extremity/Trunk Assessment Upper Extremity Assessment Upper Extremity Assessment: Overall WFL for tasks assessed     Cervical / Trunk Assessment Cervical / Trunk Assessment:  Normal   Communication Communication Communication: No difficulties   Cognition Arousal/Alertness: Awake/alert Behavior During Therapy: WFL for tasks assessed/performed Overall Cognitive Status: Within Functional Limits for tasks assessed                                     General Comments       Exercises     Shoulder Instructions      Home Living Family/patient expects to be discharged to:: Private residence Living Arrangements: Alone Available Help at Discharge: Family(daughter) Type of Home: House Home Access: Stairs to enter Entergy CorporationEntrance Stairs-Number of Steps: 1 Entrance Stairs-Rails: None Home Layout: Two level;Bed/bath upstairs Alternate Level Stairs-Number of Steps: 17 Alternate Level Stairs-Rails: Right Bathroom Shower/Tub: Chief Strategy OfficerTub/shower unit   Bathroom Toilet: Standard                Prior Functioning/Environment Level of Independence: Independent        Comments: Pt reports no falls in past 6 months.        OT Problem List:        OT Treatment/Interventions:  OT Goals(Current goals can be found in the care plan section) Acute Rehab OT Goals Patient Stated Goal: To return home OT Goal Formulation: With patient Potential to Achieve Goals: Good  OT Frequency:     Barriers to D/C:            Co-evaluation              AM-PAC OT "6 Clicks" Daily Activity     Outcome Measure Help from another person eating meals?: None Help from another person taking care of personal grooming?: None Help from another person toileting, which includes using toliet, bedpan, or urinal?: None Help from another person bathing (including washing, rinsing, drying)?: None Help from another person to put on and taking off regular upper body clothing?: None Help from another person to put on and taking off regular lower body clothing?: None 6 Click Score: 24   End of Session    Activity Tolerance: Patient tolerated treatment well Patient  left: in bed  OT Visit Diagnosis: Muscle weakness (generalized) (M62.81)                Time: 1340-1405 OT Time Calculation (min): 25 min Charges:  OT General Charges $OT Visit: 1 Visit OT Evaluation $OT Eval Moderate Complexity: 1 Mod  Kara Carina, MS, OTR/L  Kara Rasmussen 11/19/2018, 3:15 PM

## 2018-11-19 NOTE — Progress Notes (Signed)
*  PRELIMINARY RESULTS* Echocardiogram 2D Echocardiogram has been performed.  Sherrie Sport 11/19/2018, 9:57 AM

## 2018-11-19 NOTE — Progress Notes (Signed)
*  PRELIMINARY RESULTS* Echocardiogram 2D Echocardiogram has been performed.  Kara Rasmussen 11/19/2018, 9:56 AM

## 2018-11-19 NOTE — Evaluation (Signed)
Physical Therapy Evaluation Patient Details Name: Kara Rasmussen MRN: [id number] DOB: 27-Nov-1945 Today's Date: 11/19/2018   History of Present Illness  Pt is a 73 y.o. female presenting to hospital 11/18/18 with slight R facial droop, dizziness, and aphasia.  MRI showing acute L MCA infarct (non-hemorrhagic).  PMH includes DM, htn, h/o CVA Nov 1999.  Clinical Impression  Prior to hospital admission, pt was independent.  Pt lives with her daughter in 2 level home.  Currently pt is independent with bed mobility, transfers, and ambulation 320 feet; and modified independent navigating 16 stairs.  No loss of balance noted with dynamic activities during ambulation.  B LE strength, light touch, heel to shin coordination, tone, and proprioception intact.  No acute PT needs identified; will discharge pt from PT in house.  Pt verbalizing understanding and reporting no questions or concerns for therapist.    Follow Up Recommendations No PT follow up    Equipment Recommendations  None recommended by PT    Recommendations for Other Services       Precautions / Restrictions Restrictions Weight Bearing Restrictions: No      Mobility  Bed Mobility Overal bed mobility: Independent                Transfers Overall transfer level: Independent Equipment used: None             General transfer comment: steady safe transfers noted  Ambulation/Gait Ambulation/Gait assistance: Independent Gait Distance (Feet): 320 Feet Assistive device: None Gait Pattern/deviations: WFL(Within Functional Limits)   Gait velocity interpretation: >2.62 ft/sec, indicative of community ambulatory General Gait Details: steady ambulation  Stairs Stairs: Yes Stairs assistance: Modified independent (Device/Increase time) Stair Management: One rail Right;No rails Number of Stairs: 16 General stair comments: ascended/descended 1 step no railing and 15 steps with R railing; steady and safe  Wheelchair  Mobility    Modified Rankin (Stroke Patients Only)       Balance Overall balance assessment: Independent                               Standardized Balance Assessment Standardized Balance Assessment : (No loss of balance with ambulation and head turns R/L/up/down, mild increase/decrease in speed, and turning and stopping)           Pertinent Vitals/Pain Pain Assessment: No/denies pain  Vitals (HR and O2 on room air) stable and WFL throughout treatment session.    Home Living Family/patient expects to be discharged to:: Private residence Living Arrangements: Children(Pt's daughter and her 2 dogs) Available Help at Discharge: Family Type of Home: House Home Access: Stairs to enter Entrance Stairs-Rails: None Entrance Stairs-Number of Steps: 1 Home Layout: Two level;Bed/bath upstairs        Prior Function Level of Independence: Independent         Comments: Pt reports no falls in past 6 months.     Hand Dominance   Dominant Hand: Right    Extremity/Trunk Assessment   Upper Extremity Assessment Upper Extremity Assessment: Defer to OT evaluation    Lower Extremity Assessment Lower Extremity Assessment: RLE deficits/detail;LLE deficits/detail(intact B LE coordination (heel to shin), light touch, proprioception, and tone) RLE Deficits / Details: hip flexion 5/5, knee flexion/extension 5/5, and DF 5/5 LLE Deficits / Details: hip flexion 5/5, knee flexion/extension 5/5, and DF 5/5    Cervical / Trunk Assessment Cervical / Trunk Assessment: Normal  Communication   Communication: No difficulties  Cognition Arousal/Alertness: Awake/alert  Behavior During Therapy: WFL for tasks assessed/performed Overall Cognitive Status: Within Functional Limits for tasks assessed                                        General Comments   Pt agreeable to PT session.    Exercises     Assessment/Plan    PT Assessment Patent does not need any  further PT services  PT Problem List         PT Treatment Interventions      PT Goals (Current goals can be found in the Care Plan section)  Acute Rehab PT Goals Patient Stated Goal: to go home PT Goal Formulation: With patient Time For Goal Achievement: 12/03/18 Potential to Achieve Goals: Good    Frequency     Barriers to discharge        Co-evaluation               AM-PAC PT "6 Clicks" Mobility  Outcome Measure Help needed turning from your back to your side while in a flat bed without using bedrails?: None Help needed moving from lying on your back to sitting on the side of a flat bed without using bedrails?: None Help needed moving to and from a bed to a chair (including a wheelchair)?: None Help needed standing up from a chair using your arms (e.g., wheelchair or bedside chair)?: None Help needed to walk in hospital room?: None Help needed climbing 3-5 steps with a railing? : None 6 Click Score: 24    End of Session Equipment Utilized During Treatment: Gait belt Activity Tolerance: Patient tolerated treatment well Patient left: (sitting on edge of bed with SLP present) Nurse Communication: Mobility status PT Visit Diagnosis: Muscle weakness (generalized) (M62.81)    Time: 1050-1110 PT Time Calculation (min) (ACUTE ONLY): 20 min   Charges:   PT Evaluation $PT Eval Low Complexity: 1 Low         [name], PT 11/19/18, 12:02 PM [phone]

## 2018-11-19 NOTE — Evaluation (Signed)
Speech Language Pathology Evaluation Patient Details Name: Kara Rasmussen MRN: [id number] DOB: [birth year] Today's Date: 11/19/2018 Time: 2993-7169 SLP Time Calculation (min) (ACUTE ONLY): 40 min  Problem List:  Patient Active Problem List   Diagnosis Date Noted  . CVA (cerebral vascular accident) ([place]) 11/18/2018  . Post-menopausal bleeding 03/03/2015   Past Medical History:  Past Medical History:  Diagnosis Date  . Diabetes mellitus without complication ([place])    Type II  . GERD (gastroesophageal reflux disease)   . Hypercholesterolemia   . Hypertension    Past Surgical History: History reviewed. No pertinent surgical history. HPI:  H&P 11/18/2018: "Kara Rasmussen  is a 73 y.o. 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  Right facial droop found on arrival.  Here in the emergency room a CT scan of the head was done which showed nothing acute.  MRI of the brain shows left MCA territory CVA.  Patient at this time has no focal weakness or numbness.  Her only issue seems to be mild right facial droop.  She is being admitted for further work-up of acute CVA.  She is on Lipitor, aspirin at home.  Has seen Dr. Melrose Nakayama at [facility] neurology in the past but does not want to follow-up with him after discharge."  MRI 11/18/2018: "Acute LEFT MCA territory infarct, nonhemorrhagic. Atrophy and small vessel disease with a remote LEFT MCA territory infarct, immediately adjacent."   Assessment / Plan / Recommendation Clinical Impression  The patient is presenting with minimal, if any, aphasia per language screening.  She earned an [place] [employer].  Her spontaneous speech is minimally reduced in content and she has some hesitations/word-finding difficulty.  She scored full marks for auditory comprehension (yes/no  questions, sequential commands), repetition, and confrontation naming.  The patient does not need to be followed for speech therapy in the hospital.  She was counseled to seek referral to speech therapy if she finds that she has experienced change in speech.    SLP Assessment  SLP Recommendation/Assessment: Patient does not need any further Speech Lanaguage Pathology Services SLP Visit Diagnosis: Aphasia (R47.01)    Follow Up Recommendations       Frequency and Duration           SLP Evaluation   Western Aphasia Battery- Screening  Spontaneous Speech      Information content   9/10       Fluency    9/10     Comprehension     Yes/No questions   10/10          Sequential Commands  10/10     Repetition    10/10     Naming    Object Naming   10/10       Screening Aphasia Quotient 97/100    Cognition          Comprehension  Auditory Comprehension Overall Auditory Comprehension: Appears within functional limits for tasks assessed    Expression Expression Primary Mode of Expression: Verbal Verbal Expression Overall Verbal Expression: Appears within functional limits for tasks assessed Written Expression Dominant Hand: Right   Oral / Motor  Motor Speech Overall Motor Speech: Appears within functional limits for tasks assessed   GO                   Margarite Gouge  Melissa Pulido, MS/CCC- SLP  Tedd SiasAbernathy, Susie 11/19/2018, 11:41 AM

## 2018-11-20 LAB — HIV ANTIBODY (ROUTINE TESTING W REFLEX): HIV Screen 4th Generation wRfx: NONREACTIVE

## 2018-11-20 LAB — GLUCOSE, CAPILLARY: Glucose-Capillary: 116 mg/dL — ABNORMAL HIGH (ref 70–99)

## 2018-11-20 MED ORDER — ASPIRIN 325 MG PO TBEC: 325.0000 mg | DELAYED_RELEASE_TABLET | Freq: Every day | ORAL | 0 refills | Status: AC

## 2018-11-20 NOTE — Discharge Summary (Signed)
[facility] [place] [facility]   PATIENT NAME: Kara Rasmussen    MR#:  [id number]  DATE OF BIRTH:  [birth year]  DATE OF ADMISSION:  11/18/2018 ADMITTING PHYSICIAN: Milagros LollSrikar Sudini, MD  DATE OF DISCHARGE: 11/20/2018  PRIMARY CARE PHYSICIAN: Patrice ParadiseMcLaughlin, Miriam K, MD   ADMISSION DIAGNOSIS:  TIA (transient ischemic attack) [G45.9] Cerebrovascular accident (CVA), unspecified mechanism (HCC) [I63.9]  DISCHARGE DIAGNOSIS:  Active Problems:   CVA (cerebral vascular accident) (HCC) Type 2 diabetes mellitus GERD Hyperlipidemia  SECONDARY DIAGNOSIS:   Past Medical History:  Diagnosis Date  . Diabetes mellitus without complication (HCC)    Type II  . GERD (gastroesophageal reflux disease)   . Hypercholesterolemia   . Hypertension      ADMITTING HISTORY Kara Rasmussen  is a 73 y.o. 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  Right facial droop found on arrival.  Here in the emergency room a CT scan of the head was done which showed nothing acute.  MRI of the brain shows left MCA territory CVA.  Patient at this time has no focal weakness or numbness.  Her only issue seems to be mild right facial droop.  She is being admitted for further work-up of acute CVA.  She is on Lipitor, aspirin at home. [facility] past but does not want to follow-up with him after discharge.  HOSPITAL COURSE:  Was admitted treated for CVA.  Patient was worked up with CT head and MRI brain.  MRI brain showed a left MCA territory CVA.  Patient was started on aspirin and Lipitor.  Neurology attending evaluated the patient during hospitalization.  Patient was worked up with echocardiogram and carotid ultrasound during the hospitalization.  Also seen by speech and physical therapy.  Patient will be discharged home on oral  aspirin and statin medication.  Follow-up with neurology in the clinic.  CONSULTS OBTAINED:  Treatment Team:  Marita SnellenGuerch, Meziane, MD  DRUG ALLERGIES:   Allergies  Allergen Reactions  . Procaine Hives    Passed out    DISCHARGE MEDICATIONS:   Allergies as of 11/20/2018      Reactions   Procaine Hives   Passed out      Medication List    TAKE these medications   aspirin 325 MG EC tablet Take 1 tablet (325 mg total) by mouth daily for 30 days. What changed:  medication strength how much to take   Cannabidiol 100 MG/ML Soln Take 100 mg by mouth as directed.   folic acid 1 MG tablet Commonly known as: FOLVITE Take 1 mg by mouth daily.   lisinopril-hydrochlorothiazide 10-12.5 MG tablet Commonly known as: ZESTORETIC Take 1 tablet by mouth daily.   metFORMIN 1000 MG tablet Commonly known as: GLUCOPHAGE Take 1,000 mg by mouth 2 (two) times daily.   omeprazole 40 MG capsule Commonly known as: PRILOSEC Take 40 mg by mouth daily.   Pravachol 40 MG tablet Generic drug: pravastatin Take 40 mg by mouth daily.       Today  Patient seen and evaluated today No difficulty in speech Tolerating diet well Hemodynamically stable  VITAL SIGNS:  Blood pressure 124/72, pulse 88, temperature (!) 97.5 F (36.4 C), temperature source Oral, resp. rate 15, height 5\' 8"  (1.727 m), weight 92.7 kg, SpO2 96 %.  I/O:    Intake/Output  Summary (Last 24 hours) at 11/20/2018 0951 Last data filed at 11/19/2018 1313 Gross per 24 hour  Intake 240 ml  Output -  Net 240 ml    PHYSICAL EXAMINATION:  Physical Exam  GENERAL:  73 y.o.-year-old patient lying in the bed with no acute distress.  LUNGS: Normal breath sounds bilaterally, no wheezing, rales,rhonchi or crepitation. No use of accessory muscles of respiration.  CARDIOVASCULAR: S1, S2 normal. No murmurs, rubs, or gallops.  ABDOMEN: Soft, non-tender, non-distended. Bowel sounds present. No organomegaly or mass.  NEUROLOGIC: Moves  all 4 extremities. PSYCHIATRIC: The patient is alert and oriented x 3.  SKIN: No obvious rash, lesion, or ulcer.   DATA REVIEW:   CBC Recent Labs  Lab 11/18/18 1704  WBC 7.5  HGB 11.4*  HCT 37.4  PLT 342    Chemistries  Recent Labs  Lab 11/18/18 1704  NA 141  K 3.6  CL 105  CO2 23  GLUCOSE 102*  BUN 8  CREATININE 0.84  CALCIUM 9.4  AST 19  ALT 18  ALKPHOS 100  BILITOT 0.3    Cardiac Enzymes Recent Labs  Lab 11/18/18 1704  TROPONINI <0.03    Microbiology Results  Results for orders placed or performed during the hospital encounter of 11/18/18  SARS Coronavirus 2 (CEPHEID - Performed in [facility] hospital lab), Hosp Order     Status: None   Collection Time: 11/18/18  5:04 PM   Specimen: Nasopharyngeal Swab  Result Value Ref Range Status   SARS Coronavirus 2 NEGATIVE NEGATIVE Final    Comment: (NOTE) If result is NEGATIVE SARS-CoV-2 target nucleic acids are NOT DETECTED. The SARS-CoV-2 RNA is generally detectable in upper and lower  respiratory specimens during the acute phase of infection. The lowest  concentration of SARS-CoV-2 viral copies this assay can detect is 250  copies / mL. A negative result does not preclude SARS-CoV-2 infection  and should not be used as the sole basis for treatment or other  patient management decisions.  A negative result may occur with  improper specimen collection / handling, submission of specimen other  than nasopharyngeal swab, presence of viral mutation(s) within the  areas targeted by this assay, and inadequate number of viral copies  (<250 copies / mL). A negative result must be combined with clinical  observations, patient history, and epidemiological information. If result is POSITIVE SARS-CoV-2 target nucleic acids are DETECTED. The SARS-CoV-2 RNA is generally detectable in upper and lower  respiratory specimens dur ing the acute phase of infection.  Positive  results are indicative of active infection with  SARS-CoV-2.  Clinical  correlation with patient history and other diagnostic information is  necessary to determine patient infection status.  Positive results do  not rule out bacterial infection or co-infection with other viruses. If result is PRESUMPTIVE POSTIVE SARS-CoV-2 nucleic acids MAY BE PRESENT.   A presumptive positive result was obtained on the submitted specimen  and confirmed on repeat testing.  While 2019 novel coronavirus  (SARS-CoV-2) nucleic acids may be present in the submitted sample  additional confirmatory testing may be necessary for epidemiological  and / or clinical management purposes  to differentiate between  SARS-CoV-2 and other Sarbecovirus currently known to infect humans.  If clinically indicated additional testing with an alternate test  methodology [phone]) is advised. The SARS-CoV-2 RNA is generally  detectable in upper and lower respiratory sp ecimens during the acute  phase of infection. The expected result is Negative. Fact Sheet for Patients:  StrictlyIdeas.no Fact Sheet for Healthcare Providers: BankingDealers.co.za This test is not yet approved or cleared by the [place] FDA and has been authorized for detection and/or diagnosis of SARS-CoV-2 by FDA under an Emergency Use Authorization (EUA).  This EUA will remain in effect (meaning this test can be used) for the duration of the COVID-19 declaration under Section 564(b)(1) of the Act, 21 U.S.C. section 360bbb-3(b)(1), unless the authorization is terminated or revoked sooner. Performed at [facility], [address], [place], [place] [address]     RADIOLOGY:  Mr Herby Abraham Contrast  Result Date: 11/18/2018 CLINICAL DATA:  Mild mental slowing with slurred speech. Cognitive change. EXAM: MRI HEAD WITHOUT CONTRAST TECHNIQUE: Multiplanar, multiecho pulse sequences of the brain and surrounding structures were obtained without  intravenous contrast. COMPARISON:  02/09/2018 MRI FINDINGS: Brain: There is an acute LEFT MCA territory infarct affecting the lateral lentiform nucleus and periventricular white matter, lenticulostriate artery territory. This is adjacent to, and slightly posterior and lateral to an old infarct in roughly the same distribution. Chronic ex vacuo enlargement LEFT lateral ventricle. Mild cerebral and cerebellar atrophy. Mild subcortical and periventricular T2 and FLAIR hyperintensities, likely chronic microvascular ischemic change. Regional gliosis of the white matter accompanies the old infarct and ex vacuo ventricular enlargement. Vascular: Normal flow voids. Specifically, the LEFT ICA and LEFT MCA appear normal. Skull and upper cervical spine: Normal marrow signal. Sinuses/Orbits: Negative. Other: None. IMPRESSION: Acute LEFT MCA territory infarct, nonhemorrhagic. Atrophy and small vessel disease with a remote LEFT MCA territory infarct, immediately adjacent. These results were called by telephone at the time of interpretation on 11/18/2018 at 4:18 pm to Dr. Gertie Exon assistant who verbally acknowledged these results. Electronically Signed   By: Staci Righter M.D.   On: 11/18/2018 16:24   US Carotid Bilateral (at [facility] Only)  Result Date: 11/19/2018 CLINICAL DATA:  TIA symptoms, hypertension, hyperlipidemia, diabetes EXAM: BILATERAL CAROTID DUPLEX ULTRASOUND TECHNIQUE: Pearline Cables scale imaging, color Doppler and duplex ultrasound were performed of bilateral carotid and vertebral arteries in the neck. COMPARISON:  None. FINDINGS: Criteria: Quantification of carotid stenosis is based on velocity parameters that correlate the residual internal carotid diameter with NASCET-based stenosis levels, using the diameter of the distal internal carotid lumen as the denominator for stenosis measurement. The following velocity measurements were obtained: RIGHT ICA: 122/27 cm/sec CCA: 371/69 cm/sec SYSTOLIC ICA/CCA RATIO:  1.2  ECA: 112 cm/sec LEFT ICA: 154/47 cm/sec CCA: 67/89 cm/sec SYSTOLIC ICA/CCA RATIO:  1.9 ECA: 68 cm/sec RIGHT CAROTID ARTERY: Minor echogenic shadowing plaque formation. No hemodynamically significant right ICA stenosis, velocity elevation, or turbulent flow. Degree of narrowing less than 50%. RIGHT VERTEBRAL ARTERY:  Antegrade LEFT CAROTID ARTERY: Similar scattered minor echogenic plaque formation. No hemodynamically significant left ICA stenosis, velocity elevation, or turbulent flow. LEFT VERTEBRAL ARTERY:  Antegrade IMPRESSION: Minor carotid atherosclerosis. No hemodynamically significant ICA stenosis. Degree of narrowing less than 50% bilaterally by ultrasound criteria. Patent antegrade vertebral flow bilaterally Electronically Signed   By: Jerilynn Mages.  Shick M.D.   On: 11/19/2018 09:32    Follow up with PCP in 1 week.  Management plans discussed with the patient, family and they are in agreement.  CODE STATUS: DNR    Code Status Orders  (From admission, onward)         Start     Ordered   11/18/18 1941  Do not attempt resuscitation (DNR)  Continuous    Question Answer Comment  In the event of cardiac or respiratory ARREST Do  not call a "code blue"   In the event of cardiac or respiratory ARREST Do not perform Intubation, CPR, defibrillation or ACLS   In the event of cardiac or respiratory ARREST Use medication by any route, position, wound care, and other measures to relive pain and suffering. May use oxygen, suction and manual treatment of airway obstruction as needed for comfort.      11/18/18 1944        Code Status History    This patient has a current code status but no historical code status.   Advance Care Planning Activity      TOTAL TIME TAKING CARE OF THIS PATIENT ON DAY OF DISCHARGE: more than 35 minutes.   Ihor AustinPavan Jetaun Colbath M.D on 11/20/2018 at 9:51 AM  Between 7am to 6pm - Pager - [phone]  After 6pm go to [email or web address] - password [email or web address] [facility]  [facility] [place] [facility]   Office  [phone]  CC: Primary care physician; Patrice ParadiseMcLaughlin, Miriam K, MD  Note: This dictation was prepared with Dragon dictation along with smaller phrase technology. Any transcriptional errors that result from this process are unintentional.

## 2019-02-10 ENCOUNTER — Other Ambulatory Visit
Admission: RE | Admit: 2019-02-10 | Discharge: 2019-02-10 | Disposition: A | Discharge status: Home / Self Care | Payer: Medicare Other | Source: Ambulatory Visit | Attending: Student | Admitting: Student

## 2019-02-10 DIAGNOSIS — R197 Diarrhea, unspecified: Secondary | ICD-10-CM | POA: Diagnosis present

## 2019-02-10 LAB — C DIFFICILE QUICK SCREEN W PCR REFLEX
C Diff antigen: NEGATIVE
C Diff interpretation: NOT DETECTED
C Diff toxin: NEGATIVE

## 2019-02-12 ENCOUNTER — Ambulatory Visit: Payer: Medicare Other | Admitting: Speech Pathology

## 2019-02-13 LAB — CALPROTECTIN, FECAL: Calprotectin, Fecal: 49 ug/g (ref 0–120)

## 2019-02-19 LAB — PANCREATIC ELASTASE, FECAL: Pancreatic Elastase-1, Stool: <50 ug Elast./g — ABNORMAL LOW (ref >200)

## 2019-02-20 ENCOUNTER — Encounter: Payer: Medicare Other | Admitting: Speech Pathology

## 2019-02-25 ENCOUNTER — Encounter: Payer: Medicare Other | Admitting: Speech Pathology

## 2019-02-27 ENCOUNTER — Encounter: Payer: Medicare Other | Admitting: Speech Pathology

## 2019-03-04 ENCOUNTER — Encounter: Payer: Medicare Other | Admitting: Speech Pathology

## 2019-03-06 ENCOUNTER — Encounter: Payer: Medicare Other | Admitting: Speech Pathology

## 2019-03-11 ENCOUNTER — Encounter: Payer: Medicare Other | Admitting: Speech Pathology

## 2019-03-13 ENCOUNTER — Encounter: Payer: Medicare Other | Admitting: Speech Pathology

## 2019-03-18 ENCOUNTER — Encounter: Payer: Medicare Other | Admitting: Speech Pathology

## 2019-03-19 ENCOUNTER — Other Ambulatory Visit
Admission: RE | Admit: 2019-03-19 | Discharge: 2019-03-19 | Disposition: A | Discharge status: Home / Self Care | Payer: Medicare Other | Source: Ambulatory Visit | Attending: Student | Admitting: Student

## 2019-03-19 DIAGNOSIS — R197 Diarrhea, unspecified: Secondary | ICD-10-CM | POA: Insufficient documentation

## 2019-03-19 DIAGNOSIS — Z86010 Personal history of colonic polyps: Secondary | ICD-10-CM | POA: Insufficient documentation

## 2019-03-20 ENCOUNTER — Encounter: Payer: Medicare Other | Admitting: Speech Pathology

## 2019-03-23 LAB — STOOL CULTURE REFLEX - RSASHR

## 2019-03-23 LAB — STOOL CULTURE: E coli, Shiga toxin Assay: NEGATIVE

## 2019-03-23 LAB — STOOL CULTURE REFLEX - CMPCXR

## 2019-03-24 LAB — O&P RESULT

## 2019-03-24 LAB — OVA + PARASITE EXAM

## 2019-03-25 ENCOUNTER — Encounter: Payer: Medicare Other | Admitting: Speech Pathology

## 2019-04-18 ENCOUNTER — Other Ambulatory Visit: Payer: Self-pay

## 2019-04-18 ENCOUNTER — Other Ambulatory Visit
Admission: RE | Admit: 2019-04-18 | Discharge: 2019-04-18 | Disposition: A | Discharge status: Home / Self Care | Payer: Medicare Other | Source: Ambulatory Visit | Attending: Internal Medicine | Admitting: Internal Medicine

## 2019-04-18 DIAGNOSIS — Z01812 Encounter for preprocedural laboratory examination: Secondary | ICD-10-CM | POA: Insufficient documentation

## 2019-04-18 DIAGNOSIS — Z20828 Contact with and (suspected) exposure to other viral communicable diseases: Secondary | ICD-10-CM | POA: Diagnosis not present

## 2019-04-19 LAB — SARS CORONAVIRUS 2 (TAT 6-24 HRS): SARS Coronavirus 2: NEGATIVE

## 2019-04-22 ENCOUNTER — Encounter: Payer: Self-pay | Admitting: *Deleted

## 2019-04-23 ENCOUNTER — Ambulatory Visit: Payer: Medicare Other | Admitting: Anesthesiology

## 2019-04-23 ENCOUNTER — Other Ambulatory Visit: Payer: Self-pay

## 2019-04-23 ENCOUNTER — Ambulatory Visit
Admission: RE | Admit: 2019-04-23 | Discharge: 2019-04-23 | Disposition: A | Discharge status: Home / Self Care | Payer: Medicare Other | Attending: Internal Medicine | Admitting: Internal Medicine

## 2019-04-23 ENCOUNTER — Encounter: Payer: Self-pay | Admitting: *Deleted

## 2019-04-23 ENCOUNTER — Encounter
Admission: RE | Disposition: A | Discharge status: Home / Self Care | Payer: Self-pay | Source: Home / Self Care | Attending: Internal Medicine

## 2019-04-23 DIAGNOSIS — K5730 Diverticulosis of large intestine without perforation or abscess without bleeding: Secondary | ICD-10-CM | POA: Diagnosis not present

## 2019-04-23 DIAGNOSIS — K219 Gastro-esophageal reflux disease without esophagitis: Secondary | ICD-10-CM | POA: Diagnosis not present

## 2019-04-23 DIAGNOSIS — Z7982 Long term (current) use of aspirin: Secondary | ICD-10-CM | POA: Insufficient documentation

## 2019-04-23 DIAGNOSIS — I10 Essential (primary) hypertension: Secondary | ICD-10-CM | POA: Insufficient documentation

## 2019-04-23 DIAGNOSIS — Z1211 Encounter for screening for malignant neoplasm of colon: Secondary | ICD-10-CM | POA: Insufficient documentation

## 2019-04-23 DIAGNOSIS — Z79899 Other long term (current) drug therapy: Secondary | ICD-10-CM | POA: Insufficient documentation

## 2019-04-23 DIAGNOSIS — E782 Mixed hyperlipidemia: Secondary | ICD-10-CM | POA: Insufficient documentation

## 2019-04-23 DIAGNOSIS — K640 First degree hemorrhoids: Secondary | ICD-10-CM | POA: Diagnosis not present

## 2019-04-23 DIAGNOSIS — E119 Type 2 diabetes mellitus without complications: Secondary | ICD-10-CM | POA: Diagnosis not present

## 2019-04-23 DIAGNOSIS — Z87891 Personal history of nicotine dependence: Secondary | ICD-10-CM | POA: Insufficient documentation

## 2019-04-23 DIAGNOSIS — Z8719 Personal history of other diseases of the digestive system: Secondary | ICD-10-CM | POA: Insufficient documentation

## 2019-04-23 DIAGNOSIS — Z8371 Family history of colonic polyps: Secondary | ICD-10-CM | POA: Diagnosis not present

## 2019-04-23 DIAGNOSIS — Z8673 Personal history of transient ischemic attack (TIA), and cerebral infarction without residual deficits: Secondary | ICD-10-CM | POA: Insufficient documentation

## 2019-04-23 DIAGNOSIS — Z7984 Long term (current) use of oral hypoglycemic drugs: Secondary | ICD-10-CM | POA: Diagnosis not present

## 2019-04-23 HISTORY — DX: Pain in right foot: M79.672

## 2019-04-23 HISTORY — DX: Cerebral infarction, unspecified: I63.9

## 2019-04-23 HISTORY — DX: Pain in right foot: M79.671

## 2019-04-23 HISTORY — DX: Mixed hyperlipidemia: E78.2

## 2019-04-23 HISTORY — PX: COLONOSCOPY WITH PROPOFOL: SHX5780

## 2019-04-23 HISTORY — DX: Low back pain, unspecified: M54.50

## 2019-04-23 LAB — GLUCOSE, CAPILLARY: Glucose-Capillary: 108 mg/dL — ABNORMAL HIGH (ref 70–99)

## 2019-04-23 SURGERY — COLONOSCOPY WITH PROPOFOL
Anesthesia: General

## 2019-04-23 MED ORDER — PROPOFOL 500 MG/50ML IV EMUL
INTRAVENOUS | Status: DC | PRN
  Administered 2019-04-23: 175 ug/kg/min via INTRAVENOUS

## 2019-04-23 MED ORDER — SODIUM CHLORIDE 0.9 % IV SOLN
INTRAVENOUS | Status: DC
  Administered 2019-04-23: 14:00:00 via INTRAVENOUS

## 2019-04-23 MED ORDER — LIDOCAINE HCL (CARDIAC) PF 100 MG/5ML IV SOSY
PREFILLED_SYRINGE | INTRAVENOUS | Status: DC | PRN
  Administered 2019-04-23: 50 mg via INTRAVENOUS

## 2019-04-23 MED ORDER — PROPOFOL 10 MG/ML IV BOLUS
INTRAVENOUS | Status: DC | PRN
  Administered 2019-04-23: 70 mg via INTRAVENOUS

## 2019-04-23 NOTE — Interval H&P Note (Signed)
History and Physical Interval Note:  04/23/2019 2:31 PM  Kara Rasmussen  has presented today for surgery, with the diagnosis of PERSONAL HX.OF COLON POLYPS.  The various methods of treatment have been discussed with the patient and family. After consideration of risks, benefits and other options for treatment, the patient has consented to  Procedure(s): COLONOSCOPY WITH PROPOFOL (N/A) as a surgical intervention.  The patient's history has been reviewed, patient examined, no change in status, stable for surgery.  I have reviewed the patient's chart and labs.  Questions were answered to the patient's satisfaction.     [place], [place]

## 2019-04-23 NOTE — Anesthesia Procedure Notes (Signed)
Date/Time: 04/23/2019 2:40 PM Performed by: Johnna Acosta, CRNA Pre-anesthesia Checklist: Patient identified, Emergency Drugs available, Suction available, Patient being monitored and Timeout performed Patient Re-evaluated:Patient Re-evaluated prior to induction Oxygen Delivery Method: Nasal cannula Preoxygenation: Pre-oxygenation with 100% oxygen Induction Type: IV induction

## 2019-04-23 NOTE — H&P (Signed)
Outpatient short stay form Pre-procedure 04/23/2019 2:29 PM Kara Rasmussen, M.D.  Primary Physician: Paulita Cradle, M.D.  Reason for visit:  Personal hx of colon polyps, unknown histology ([place], c. 2014), Family history of colon polyps - Father.  History of present illness:                            Patient presents for colonoscopy for a personal hx of colon polyps. The patient denies abdominal pain, abnormal weight loss or rectal bleeding.     Current Facility-Administered Medications:  .  0.9 %  sodium chloride infusion, , Intravenous, Continuous, [place], Benay Pike, MD, Last Rate: 20 mL/hr at 04/23/19 1357  Medications Prior to Admission  Medication Sig Dispense Refill Last Dose  . aspirin 325 MG tablet Take 325 mg by mouth daily.   09/38/1829  . folic acid (FOLVITE) 1 MG tablet Take 1 mg by mouth daily.    04/22/2019 at Unknown time  . lisinopril-hydrochlorothiazide (ZESTORETIC) 10-12.5 MG tablet Take 1 tablet by mouth daily.   04/22/2019 at Unknown time  . metFORMIN (GLUCOPHAGE) 1000 MG tablet Take 1,000 mg by mouth 2 (two) times daily.   04/22/2019 at Unknown time  . omeprazole (PRILOSEC) 40 MG capsule Take 40 mg by mouth daily.   04/22/2019 at Unknown time  . PRAVACHOL 40 MG tablet Take 40 mg by mouth daily.   04/22/2019 at Unknown time  . aspirin 81 MG EC tablet Take 81 mg by mouth daily. Swallow whole.   Not Taking at Unknown time  . Cannabidiol 100 MG/ML SOLN Take 100 mg by mouth as directed.   Not Taking at Unknown time     Allergies  Allergen Reactions  . Procaine Hives    Passed out     Past Medical History:  Diagnosis Date  . Bilateral foot pain   . Diabetes mellitus without complication ([place])    Type II  . GERD (gastroesophageal reflux disease)   . Hypercholesterolemia   . Hypertension   . Mixed hyperlipidemia   . Nonspecific low back pain   . Stroke [facility])     Review of systems:  Otherwise negative.    Physical Exam  Gen: Alert, oriented.  Appears stated age.  HEENT: [place]/AT. PERRLA. Lungs: CTA, no wheezes. CV: RR nl S1, S2. Abd: soft, benign, no masses. BS+ Ext: No edema. Pulses 2+    Planned procedures: Proceed with colonoscopy. The patient understands the nature of the planned procedure, indications, risks, alternatives and potential complications including but not limited to bleeding, infection, perforation, damage to internal organs and possible oversedation/side effects from anesthesia. The patient agrees and gives consent to proceed.  Please refer to procedure notes for findings, recommendations and patient disposition/instructions.     Kara Rasmussen, M.D. Gastroenterology 04/23/2019  2:29 PM

## 2019-04-23 NOTE — Op Note (Signed)
[facility] Gastroenterology Patient Name: Kara Rasmussen Procedure Date: 04/23/2019 2:30 PM MRN: [id number] Account #: [id number] Date of Birth: 1945/11/14 Admit Type: Outpatient Age: 73 Room: [facility] ENDO ROOM 3 Gender: Female Note Status: Finalized Procedure:             Colonoscopy Indications:           Colon cancer screening in patient at increased risk:                         Family history of 1st-degree relative with colon                         polyps, High risk colon cancer surveillance: Personal                         history of colonic polyps Providers:             Boykin Nearing. Norma Fredrickson MD, MD Referring MD:          Marilynne Halsted, MD (Referring MD) Medicines:             Propofol per Anesthesia Complications:         No immediate complications. Procedure:             Pre-Anesthesia Assessment:                        - The risks and benefits of the procedure and the                         sedation options and risks were discussed with the                         patient. All questions were answered and informed                         consent was obtained.                        - Patient identification and proposed procedure were                         verified prior to the procedure by the nurse. The                         procedure was verified in the procedure room.                        - ASA Grade Assessment: III - A patient with severe                         systemic disease.                        - After reviewing the risks and benefits, the patient                         was deemed in satisfactory condition to undergo the  procedure.                        After obtaining informed consent, the colonoscope was                         passed under direct vision. Throughout the procedure,                         the patient's blood pressure, pulse, and oxygen                         saturations were monitored  continuously. The                         Colonoscope was introduced through the anus and                         advanced to the the cecum, identified by appendiceal                         orifice and ileocecal valve. The colonoscopy was                         performed without difficulty. The patient tolerated                         the procedure well. The quality of the bowel                         preparation was good. The ileocecal valve, appendiceal                         orifice, and rectum were photographed. Findings:      The perianal and digital rectal examinations were normal. Pertinent       negatives include normal sphincter tone and no palpable rectal lesions.      A few small-mouthed diverticula were found in the sigmoid colon.      Non-bleeding internal hemorrhoids were found during retroflexion. The       hemorrhoids were Grade I (internal hemorrhoids that do not prolapse).      The exam was otherwise without abnormality. Impression:            - Diverticulosis in the sigmoid colon.                        - Non-bleeding internal hemorrhoids.                        - The examination was otherwise normal.                        - No specimens collected. Recommendation:        - Patient has a contact number available for                         emergencies. The signs and symptoms of potential                         delayed complications were  discussed with the patient.                         Return to normal activities tomorrow. Written                         discharge instructions were provided to the patient.                        - Resume previous diet.                        - Continue present medications.                        - Repeat colonoscopy in 5 years for screening purposes.                        - Return to GI office PRN.                        - The findings and recommendations were discussed with                         the patient. Procedure  Code(s):     --- Professional ---                        U1324, Colorectal cancer screening; colonoscopy on                         individual at high risk Diagnosis Code(s):     --- Professional ---                        K57.30, Diverticulosis of large intestine without                         perforation or abscess without bleeding                        K64.0, First degree hemorrhoids                        Z86.010, Personal history of colonic polyps                        Z83.71, Family history of colonic polyps CPT copyright 2019 American Medical Association. All rights reserved. The codes documented in this report are preliminary and upon coder review may  be revised to meet current compliance requirements. Efrain Sella MD, MD 04/23/2019 2:56:48 PM This report has been signed electronically. Number of Addenda: 0 Note Initiated On: 04/23/2019 2:30 PM Scope Withdrawal Time: 0 hours 5 minutes 9 seconds  Total Procedure Duration: 0 hours 10 minutes 37 seconds  Estimated Blood Loss:  Estimated blood loss: none.      [facility]

## 2019-04-23 NOTE — Anesthesia Postprocedure Evaluation (Signed)
Anesthesia Post Note  Patient: Kara Rasmussen  Procedure(s) Performed: COLONOSCOPY WITH PROPOFOL (N/A )  Patient location during evaluation: Endoscopy Anesthesia Type: General Level of consciousness: awake and alert and oriented Pain management: pain level controlled Vital Signs Assessment: post-procedure vital signs reviewed and stable Respiratory status: spontaneous breathing, nonlabored ventilation and respiratory function stable Cardiovascular status: blood pressure returned to baseline and stable Postop Assessment: no signs of nausea or vomiting Anesthetic complications: no     Last Vitals:  Vitals:   04/23/19 1338 04/23/19 1458  BP: (!) 187/95 137/75  Pulse: 66   Resp: 18   Temp: (!) 35.8 C (!) 36 C  SpO2: 100%     Last Pain:  Vitals:   04/23/19 1458  TempSrc: Temporal  PainSc:                  Ceonna Frazzini

## 2019-04-23 NOTE — Anesthesia Preprocedure Evaluation (Signed)
Anesthesia Evaluation  Patient identified by MRN, date of birth, ID band Patient awake    Reviewed: Allergy & Precautions, H&P , NPO status , Patient's Chart, lab work & pertinent test results  History of Anesthesia Complications Negative for: history of anesthetic complications  Airway Mallampati: III  TM Distance: >3 FB Neck ROM: limited    Dental  (+) Chipped, Upper Dentures, Missing, Poor Dentition   Pulmonary neg pulmonary ROS, neg shortness of breath, former smoker,           Cardiovascular Exercise Tolerance: Good hypertension, (-) angina(-) DOE      Neuro/Psych CVA negative psych ROS   GI/Hepatic Neg liver ROS, GERD  Medicated and Controlled,  Endo/Other  diabetes, Type 2  Renal/GU negative Renal ROS  negative genitourinary   Musculoskeletal   Abdominal   Peds  Hematology negative hematology ROS (+)   Anesthesia Other Findings Past Medical History: No date: Bilateral foot pain No date: 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 No date: GERD (gastroesophageal reflux disease) No date: Hypercholesterolemia No date: Hypertension No date: Mixed hyperlipidemia No date: Nonspecific low back pain No date: Stroke [facility])  Past Surgical History: No date: CESAREAN SECTION No date: COLPOSCOPY  BMI    Body Mass Index: 29.65 kg/m      Reproductive/Obstetrics negative OB ROS                             Anesthesia Physical Anesthesia Plan  ASA: III  Anesthesia Plan: General   Post-op Pain Management:    Induction: Intravenous  PONV Risk Score and Plan: Propofol infusion and TIVA  Airway Management Planned: Natural Airway and Nasal Cannula  Additional Equipment:   Intra-op Plan:   Post-operative Plan:   Informed Consent: I have reviewed the patients History and Physical, chart, labs and discussed the procedure including the risks, benefits and  alternatives for the proposed anesthesia with the patient or authorized representative who has indicated his/her understanding and acceptance.     Dental Advisory Given  Plan Discussed with: Anesthesiologist, CRNA and Surgeon  Anesthesia Plan Comments: (Patient consented for risks of anesthesia including but not limited to:  - adverse reactions to medications - risk of intubation if required - damage to teeth, lips or other oral mucosa - sore throat or hoarseness - Damage to heart, brain, lungs or loss of life  Patient voiced understanding.)        Anesthesia Quick Evaluation

## 2019-04-23 NOTE — Anesthesia Post-op Follow-up Note (Signed)
Anesthesia QCDR form completed.        

## 2019-04-23 NOTE — Transfer of Care (Signed)
Immediate Anesthesia Transfer of Care Note  Patient: Kara Rasmussen  Procedure(s) Performed: COLONOSCOPY WITH PROPOFOL (N/A )  Patient Location: PACU  Anesthesia Type:General  Level of Consciousness: sedated  Airway & Oxygen Therapy: Patient Spontanous Breathing and Patient connected to nasal cannula oxygen  Post-op Assessment: Report given to RN and Post -op Vital signs reviewed and stable  Post vital signs: Reviewed and stable  Last Vitals:  Vitals Value Taken Time  BP 137/75 04/23/19 1458  Temp 36 C 04/23/19 1458  Pulse 76 04/23/19 1502  Resp 19 04/23/19 1502  SpO2 95 % 04/23/19 1502  Vitals shown include unvalidated device data.  Last Pain:  Vitals:   04/23/19 1458  TempSrc: Temporal  PainSc:          Complications: No apparent anesthesia complications

## 2019-04-24 ENCOUNTER — Encounter: Payer: Self-pay | Admitting: Internal Medicine

## 2019-11-04 ENCOUNTER — Other Ambulatory Visit: Payer: Self-pay | Admitting: Physician Assistant

## 2019-11-04 DIAGNOSIS — Z1231 Encounter for screening mammogram for malignant neoplasm of breast: Secondary | ICD-10-CM

## 2019-11-12 ENCOUNTER — Ambulatory Visit
Admission: RE | Admit: 2019-11-12 | Discharge: 2019-11-12 | Disposition: A | Discharge status: Home / Self Care | Payer: Medicare Other | Source: Ambulatory Visit | Attending: Physician Assistant | Admitting: Physician Assistant

## 2019-11-12 DIAGNOSIS — Z1231 Encounter for screening mammogram for malignant neoplasm of breast: Secondary | ICD-10-CM | POA: Insufficient documentation

## 2020-11-03 ENCOUNTER — Other Ambulatory Visit: Payer: Self-pay | Admitting: Physician Assistant

## 2020-11-03 DIAGNOSIS — Z1231 Encounter for screening mammogram for malignant neoplasm of breast: Secondary | ICD-10-CM

## 2020-11-22 ENCOUNTER — Other Ambulatory Visit: Payer: Self-pay

## 2020-11-22 ENCOUNTER — Ambulatory Visit
Admission: RE | Admit: 2020-11-22 | Discharge: 2020-11-22 | Disposition: A | Discharge status: Home / Self Care | Payer: Medicare HMO | Source: Ambulatory Visit | Attending: Physician Assistant | Admitting: Physician Assistant

## 2020-11-22 DIAGNOSIS — Z1231 Encounter for screening mammogram for malignant neoplasm of breast: Secondary | ICD-10-CM | POA: Diagnosis not present

## 2021-06-20 DIAGNOSIS — E782 Mixed hyperlipidemia: Secondary | ICD-10-CM | POA: Diagnosis not present

## 2021-06-20 DIAGNOSIS — I10 Essential (primary) hypertension: Secondary | ICD-10-CM | POA: Diagnosis not present

## 2021-06-20 DIAGNOSIS — E119 Type 2 diabetes mellitus without complications: Secondary | ICD-10-CM | POA: Diagnosis not present

## 2021-07-01 DIAGNOSIS — H2513 Age-related nuclear cataract, bilateral: Secondary | ICD-10-CM | POA: Diagnosis not present

## 2021-07-01 DIAGNOSIS — Z0100 Encounter for examination of eyes and vision without abnormal findings: Secondary | ICD-10-CM | POA: Diagnosis not present

## 2021-07-01 DIAGNOSIS — E119 Type 2 diabetes mellitus without complications: Secondary | ICD-10-CM | POA: Diagnosis not present

## 2021-07-04 DIAGNOSIS — E119 Type 2 diabetes mellitus without complications: Secondary | ICD-10-CM | POA: Diagnosis not present

## 2021-07-04 DIAGNOSIS — E782 Mixed hyperlipidemia: Secondary | ICD-10-CM | POA: Diagnosis not present

## 2021-07-04 DIAGNOSIS — I10 Essential (primary) hypertension: Secondary | ICD-10-CM | POA: Diagnosis not present

## 2021-07-04 DIAGNOSIS — Z8673 Personal history of transient ischemic attack (TIA), and cerebral infarction without residual deficits: Secondary | ICD-10-CM | POA: Diagnosis not present

## 2021-07-04 DIAGNOSIS — Z0000 Encounter for general adult medical examination without abnormal findings: Secondary | ICD-10-CM | POA: Diagnosis not present

## 2021-09-20 DIAGNOSIS — R42 Dizziness and giddiness: Secondary | ICD-10-CM | POA: Diagnosis not present

## 2021-09-20 DIAGNOSIS — Z8616 Personal history of COVID-19: Secondary | ICD-10-CM | POA: Diagnosis not present

## 2021-11-09 DIAGNOSIS — S0003XA Contusion of scalp, initial encounter: Secondary | ICD-10-CM | POA: Diagnosis not present

## 2021-11-09 DIAGNOSIS — S060XAA Concussion with loss of consciousness status unknown, initial encounter: Secondary | ICD-10-CM | POA: Diagnosis not present

## 2021-11-09 DIAGNOSIS — M5030 Other cervical disc degeneration, unspecified cervical region: Secondary | ICD-10-CM | POA: Diagnosis not present

## 2021-11-09 DIAGNOSIS — S060X1A Concussion with loss of consciousness of 30 minutes or less, initial encounter: Secondary | ICD-10-CM | POA: Diagnosis not present

## 2021-11-09 DIAGNOSIS — S060X0A Concussion without loss of consciousness, initial encounter: Secondary | ICD-10-CM | POA: Diagnosis not present

## 2021-11-09 DIAGNOSIS — W19XXXA Unspecified fall, initial encounter: Secondary | ICD-10-CM | POA: Diagnosis not present

## 2021-11-09 DIAGNOSIS — Z043 Encounter for examination and observation following other accident: Secondary | ICD-10-CM | POA: Diagnosis not present

## 2021-11-30 ENCOUNTER — Other Ambulatory Visit: Payer: Self-pay | Admitting: Physician Assistant

## 2021-11-30 DIAGNOSIS — Z1231 Encounter for screening mammogram for malignant neoplasm of breast: Secondary | ICD-10-CM

## 2021-12-14 DIAGNOSIS — I10 Essential (primary) hypertension: Secondary | ICD-10-CM | POA: Diagnosis not present

## 2021-12-14 DIAGNOSIS — E1140 Type 2 diabetes mellitus with diabetic neuropathy, unspecified: Secondary | ICD-10-CM | POA: Diagnosis not present

## 2021-12-14 DIAGNOSIS — R1312 Dysphagia, oropharyngeal phase: Secondary | ICD-10-CM | POA: Diagnosis not present

## 2021-12-14 DIAGNOSIS — M79672 Pain in left foot: Secondary | ICD-10-CM | POA: Diagnosis not present

## 2021-12-14 DIAGNOSIS — G629 Polyneuropathy, unspecified: Secondary | ICD-10-CM | POA: Diagnosis not present

## 2021-12-14 DIAGNOSIS — E538 Deficiency of other specified B group vitamins: Secondary | ICD-10-CM | POA: Diagnosis not present

## 2021-12-14 DIAGNOSIS — E782 Mixed hyperlipidemia: Secondary | ICD-10-CM | POA: Diagnosis not present

## 2021-12-14 DIAGNOSIS — E119 Type 2 diabetes mellitus without complications: Secondary | ICD-10-CM | POA: Diagnosis not present

## 2021-12-14 DIAGNOSIS — M79671 Pain in right foot: Secondary | ICD-10-CM | POA: Diagnosis not present

## 2021-12-23 ENCOUNTER — Ambulatory Visit
Admission: RE | Admit: 2021-12-23 | Discharge: 2021-12-23 | Disposition: A | Discharge status: Home / Self Care | Payer: Medicare HMO | Source: Ambulatory Visit | Attending: Physician Assistant | Admitting: Physician Assistant

## 2021-12-23 DIAGNOSIS — Z1231 Encounter for screening mammogram for malignant neoplasm of breast: Secondary | ICD-10-CM | POA: Diagnosis not present

## 2022-01-02 DIAGNOSIS — E1140 Type 2 diabetes mellitus with diabetic neuropathy, unspecified: Secondary | ICD-10-CM | POA: Diagnosis not present

## 2022-01-02 DIAGNOSIS — Z0000 Encounter for general adult medical examination without abnormal findings: Secondary | ICD-10-CM | POA: Diagnosis not present

## 2022-01-02 DIAGNOSIS — E782 Mixed hyperlipidemia: Secondary | ICD-10-CM | POA: Diagnosis not present

## 2022-01-02 DIAGNOSIS — Z8673 Personal history of transient ischemic attack (TIA), and cerebral infarction without residual deficits: Secondary | ICD-10-CM | POA: Diagnosis not present

## 2022-01-02 DIAGNOSIS — I10 Essential (primary) hypertension: Secondary | ICD-10-CM | POA: Diagnosis not present

## 2022-01-02 DIAGNOSIS — E538 Deficiency of other specified B group vitamins: Secondary | ICD-10-CM | POA: Diagnosis not present

## 2022-01-02 DIAGNOSIS — R1312 Dysphagia, oropharyngeal phase: Secondary | ICD-10-CM | POA: Diagnosis not present

## 2022-03-09 IMAGING — MG MM DIGITAL SCREENING BILAT W/ TOMO AND CAD
8 series · 8 of 24 positions shown · non-contrast
Comparison: Previous exam(s).

CLINICAL DATA: Screening.

EXAM:
DIGITAL SCREENING BILATERAL MAMMOGRAM WITH TOMOSYNTHESIS AND CAD
TECHNIQUE: Bilateral screening digital craniocaudal and mediolateral oblique
mammograms were obtained. Bilateral screening digital breast
tomosynthesis was performed. The images were evaluated with
computer-aided detection.

[R CC synth-2D]
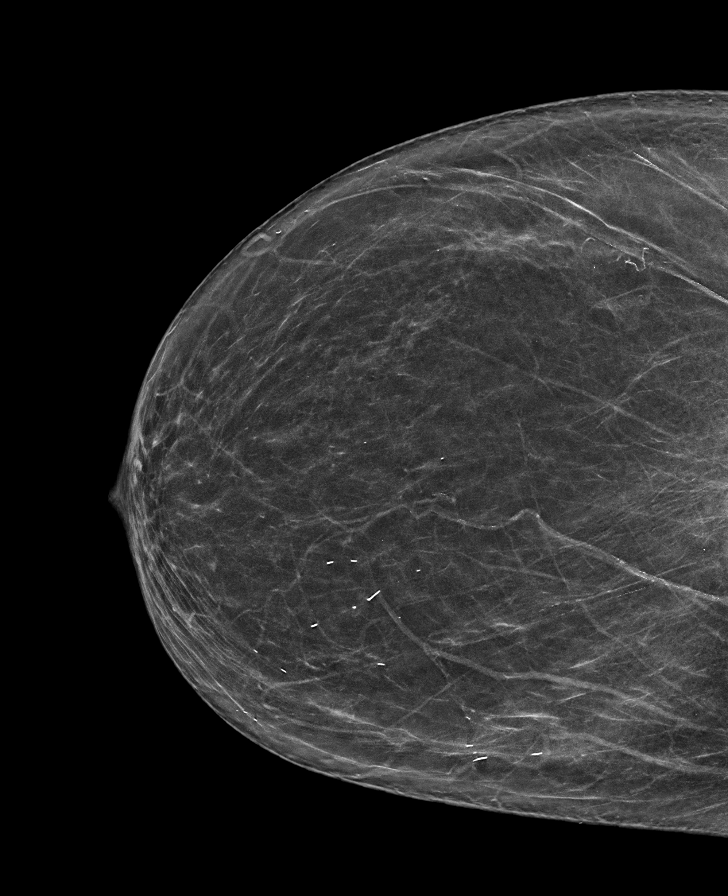

[R MLO synth-2D]
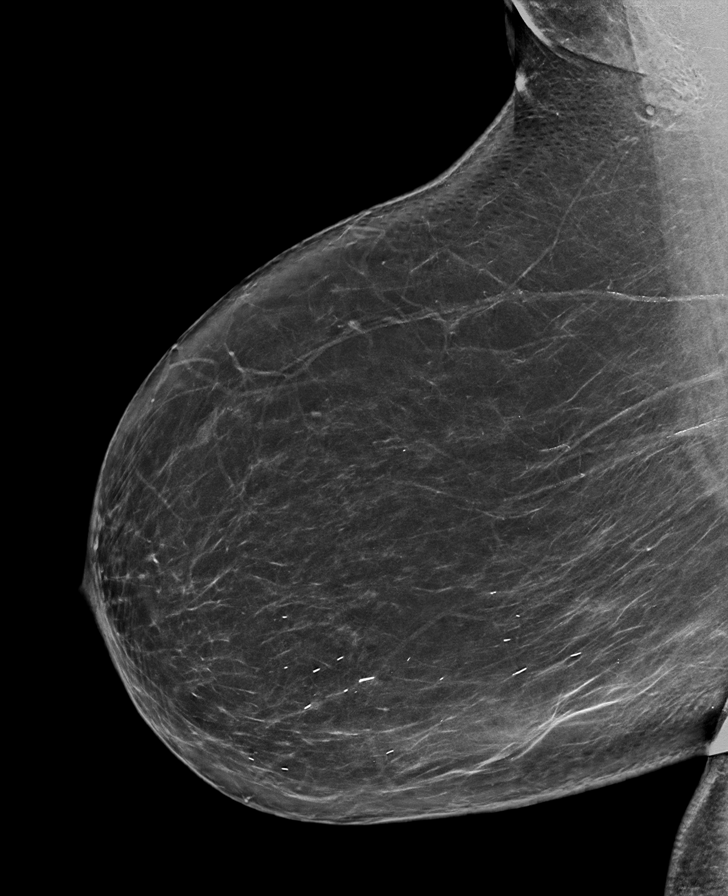

[L CC synth-2D]
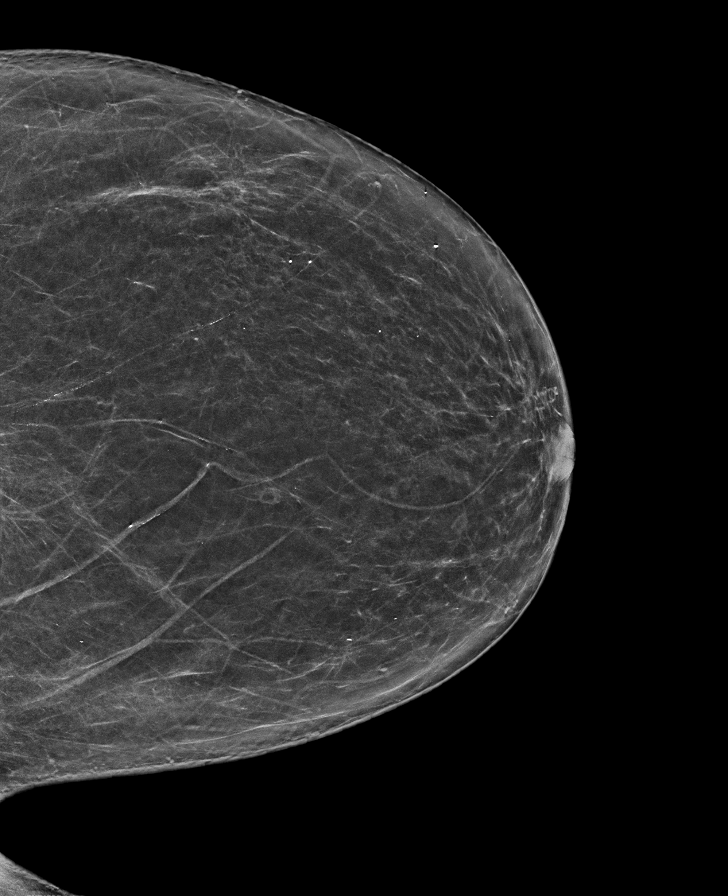

[L MLO synth-2D]
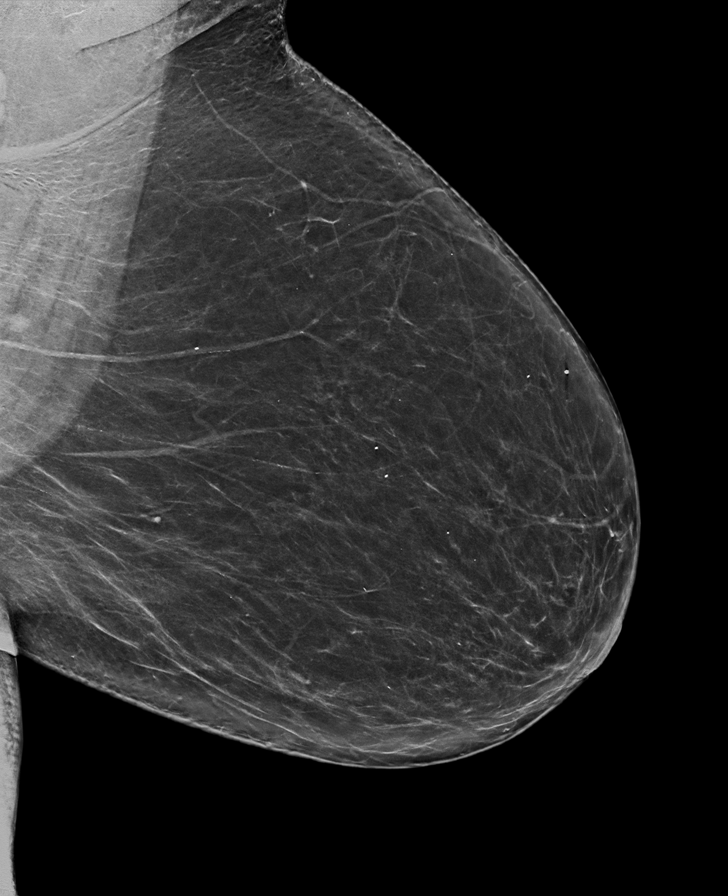

[L CC tomo · tomo slice 37/73.0]
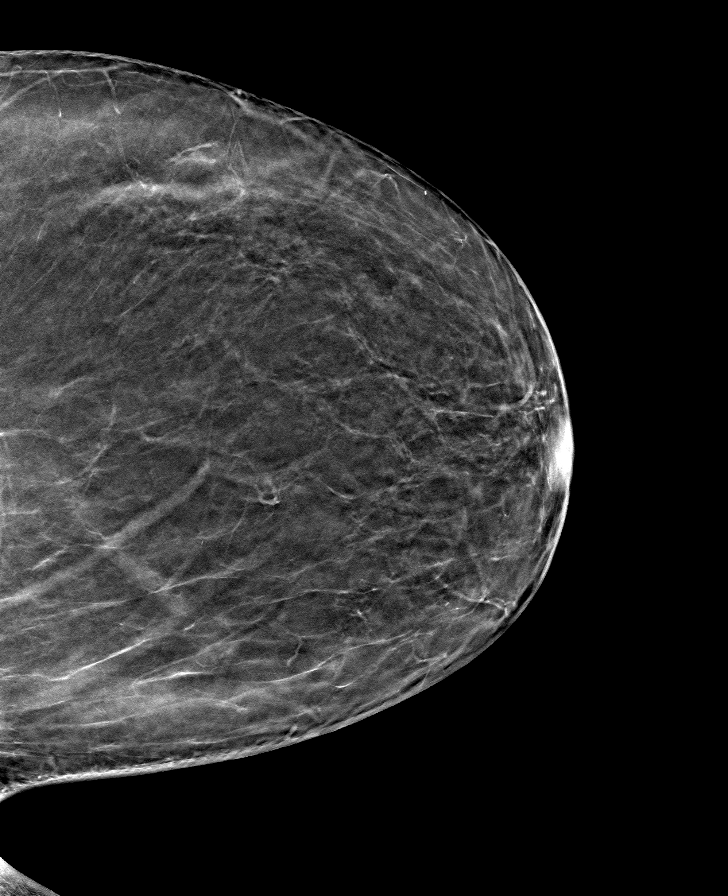

[L MLO tomo · tomo slice 41/82.0]
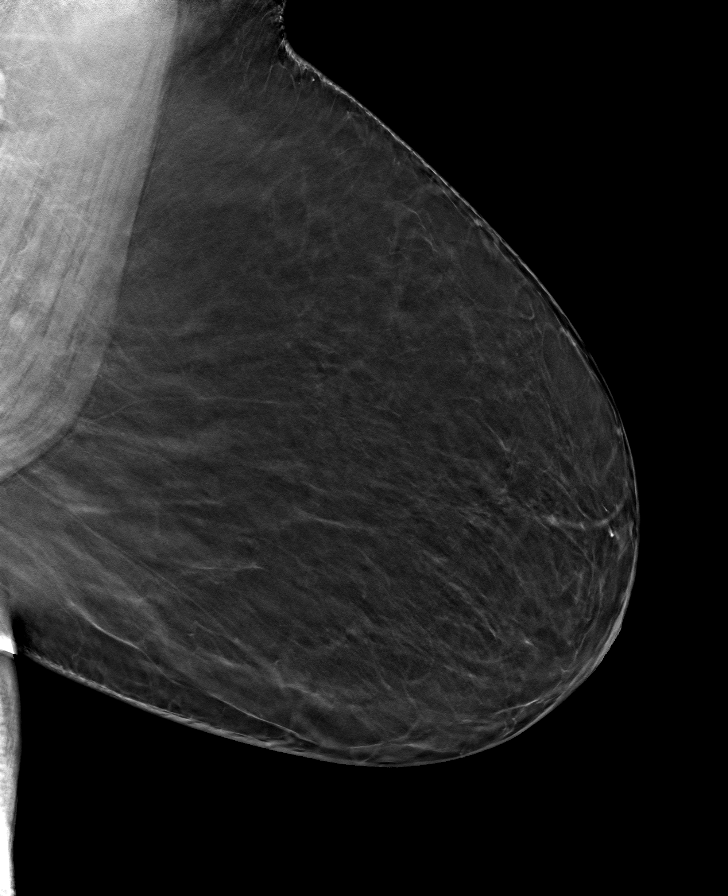

[R CC tomo · tomo slice 37/72.0]
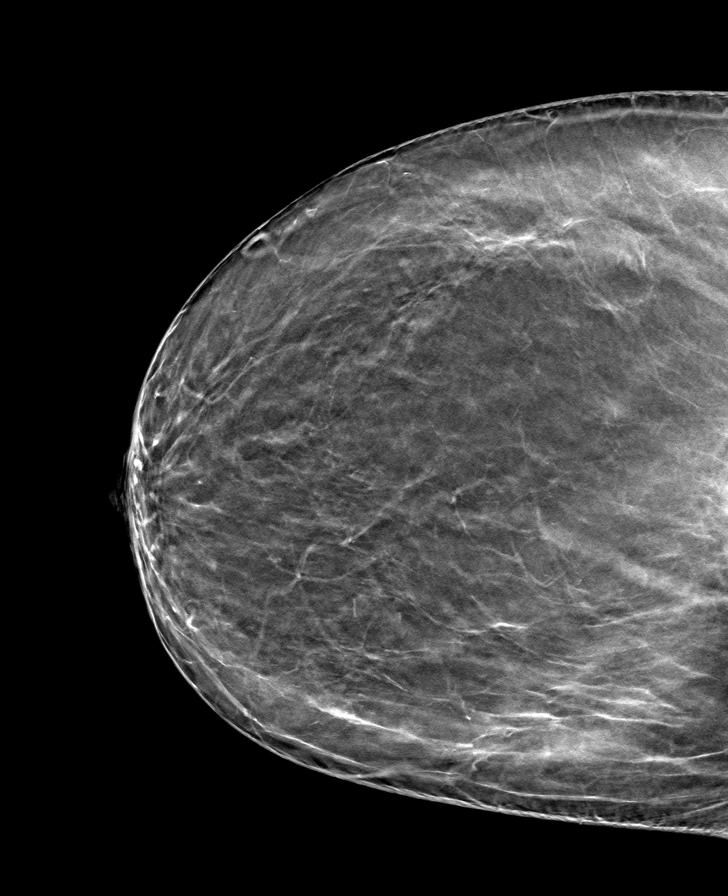

[R MLO tomo · tomo slice 43/85.0]
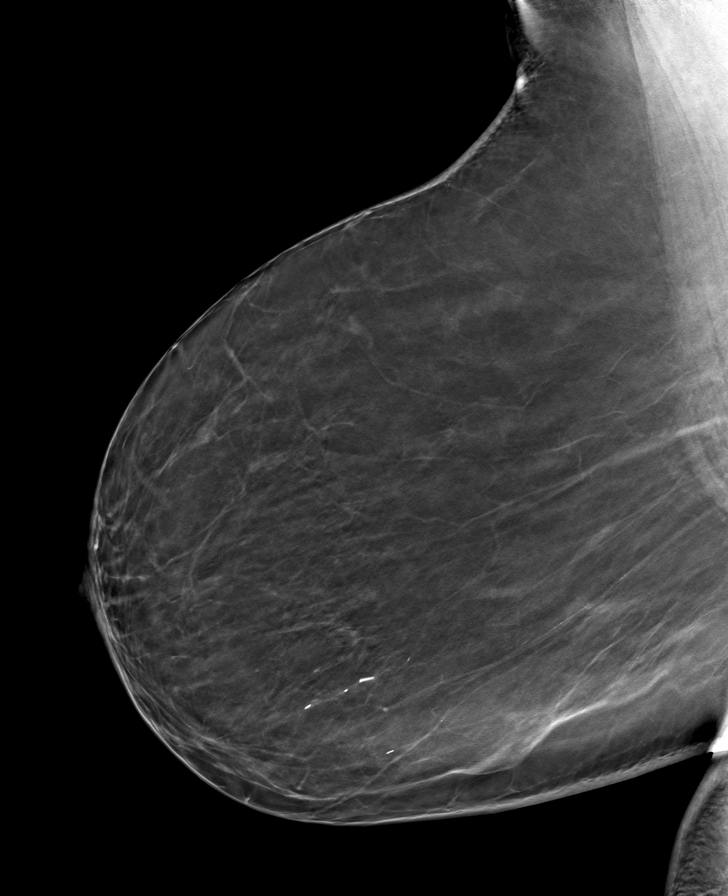

[8 of 24 positions shown; findings below may reference images not displayed]

ACR Breast Density Category b: There are scattered areas of
fibroglandular density.
FINDINGS: There are no findings suspicious for malignancy.
IMPRESSION: No mammographic evidence of malignancy. A result letter of this
screening mammogram will be mailed directly to the patient.

RECOMMENDATION:
Screening mammogram in one year. (Code:51-O-LD2)

BI-RADS CATEGORY  1: Negative.

## 2022-03-23 DIAGNOSIS — R1310 Dysphagia, unspecified: Secondary | ICD-10-CM | POA: Diagnosis not present

## 2022-03-23 DIAGNOSIS — D649 Anemia, unspecified: Secondary | ICD-10-CM | POA: Diagnosis not present

## 2022-05-23 DIAGNOSIS — N950 Postmenopausal bleeding: Secondary | ICD-10-CM | POA: Diagnosis not present

## 2022-05-23 DIAGNOSIS — N898 Other specified noninflammatory disorders of vagina: Secondary | ICD-10-CM | POA: Diagnosis not present

## 2022-05-23 DIAGNOSIS — M549 Dorsalgia, unspecified: Secondary | ICD-10-CM | POA: Diagnosis not present

## 2022-06-16 DIAGNOSIS — M545 Low back pain, unspecified: Secondary | ICD-10-CM | POA: Diagnosis not present

## 2022-06-16 DIAGNOSIS — M47816 Spondylosis without myelopathy or radiculopathy, lumbar region: Secondary | ICD-10-CM | POA: Diagnosis not present

## 2022-06-16 DIAGNOSIS — M5136 Other intervertebral disc degeneration, lumbar region: Secondary | ICD-10-CM | POA: Diagnosis not present

## 2022-06-16 DIAGNOSIS — M4316 Spondylolisthesis, lumbar region: Secondary | ICD-10-CM | POA: Diagnosis not present

## 2022-06-21 DIAGNOSIS — I10 Essential (primary) hypertension: Secondary | ICD-10-CM | POA: Diagnosis not present

## 2022-06-21 DIAGNOSIS — E785 Hyperlipidemia, unspecified: Secondary | ICD-10-CM | POA: Diagnosis not present

## 2022-06-21 DIAGNOSIS — M47816 Spondylosis without myelopathy or radiculopathy, lumbar region: Secondary | ICD-10-CM | POA: Diagnosis not present

## 2022-06-21 DIAGNOSIS — Z7984 Long term (current) use of oral hypoglycemic drugs: Secondary | ICD-10-CM | POA: Diagnosis not present

## 2022-06-21 DIAGNOSIS — Z7982 Long term (current) use of aspirin: Secondary | ICD-10-CM | POA: Diagnosis not present

## 2022-06-21 DIAGNOSIS — G8929 Other chronic pain: Secondary | ICD-10-CM | POA: Diagnosis not present

## 2022-06-21 DIAGNOSIS — Z8673 Personal history of transient ischemic attack (TIA), and cerebral infarction without residual deficits: Secondary | ICD-10-CM | POA: Diagnosis not present

## 2022-06-21 DIAGNOSIS — M5136 Other intervertebral disc degeneration, lumbar region: Secondary | ICD-10-CM | POA: Diagnosis not present

## 2022-06-21 DIAGNOSIS — E119 Type 2 diabetes mellitus without complications: Secondary | ICD-10-CM | POA: Diagnosis not present

## 2022-06-27 DIAGNOSIS — G8929 Other chronic pain: Secondary | ICD-10-CM | POA: Diagnosis not present

## 2022-06-27 DIAGNOSIS — Z7984 Long term (current) use of oral hypoglycemic drugs: Secondary | ICD-10-CM | POA: Diagnosis not present

## 2022-06-27 DIAGNOSIS — M47816 Spondylosis without myelopathy or radiculopathy, lumbar region: Secondary | ICD-10-CM | POA: Diagnosis not present

## 2022-06-27 DIAGNOSIS — N950 Postmenopausal bleeding: Secondary | ICD-10-CM | POA: Diagnosis not present

## 2022-06-27 DIAGNOSIS — E119 Type 2 diabetes mellitus without complications: Secondary | ICD-10-CM | POA: Diagnosis not present

## 2022-06-27 DIAGNOSIS — E785 Hyperlipidemia, unspecified: Secondary | ICD-10-CM | POA: Diagnosis not present

## 2022-06-27 DIAGNOSIS — Z8673 Personal history of transient ischemic attack (TIA), and cerebral infarction without residual deficits: Secondary | ICD-10-CM | POA: Diagnosis not present

## 2022-06-27 DIAGNOSIS — I10 Essential (primary) hypertension: Secondary | ICD-10-CM | POA: Diagnosis not present

## 2022-06-27 DIAGNOSIS — M5136 Other intervertebral disc degeneration, lumbar region: Secondary | ICD-10-CM | POA: Diagnosis not present

## 2022-06-27 DIAGNOSIS — Z7982 Long term (current) use of aspirin: Secondary | ICD-10-CM | POA: Diagnosis not present

## 2022-06-30 DIAGNOSIS — M47816 Spondylosis without myelopathy or radiculopathy, lumbar region: Secondary | ICD-10-CM | POA: Diagnosis not present

## 2022-06-30 DIAGNOSIS — E119 Type 2 diabetes mellitus without complications: Secondary | ICD-10-CM | POA: Diagnosis not present

## 2022-06-30 DIAGNOSIS — Z7984 Long term (current) use of oral hypoglycemic drugs: Secondary | ICD-10-CM | POA: Diagnosis not present

## 2022-06-30 DIAGNOSIS — Z7982 Long term (current) use of aspirin: Secondary | ICD-10-CM | POA: Diagnosis not present

## 2022-06-30 DIAGNOSIS — G8929 Other chronic pain: Secondary | ICD-10-CM | POA: Diagnosis not present

## 2022-06-30 DIAGNOSIS — I10 Essential (primary) hypertension: Secondary | ICD-10-CM | POA: Diagnosis not present

## 2022-06-30 DIAGNOSIS — Z8673 Personal history of transient ischemic attack (TIA), and cerebral infarction without residual deficits: Secondary | ICD-10-CM | POA: Diagnosis not present

## 2022-06-30 DIAGNOSIS — M5136 Other intervertebral disc degeneration, lumbar region: Secondary | ICD-10-CM | POA: Diagnosis not present

## 2022-06-30 DIAGNOSIS — E785 Hyperlipidemia, unspecified: Secondary | ICD-10-CM | POA: Diagnosis not present

## 2022-07-04 DIAGNOSIS — E785 Hyperlipidemia, unspecified: Secondary | ICD-10-CM | POA: Diagnosis not present

## 2022-07-04 DIAGNOSIS — Z7982 Long term (current) use of aspirin: Secondary | ICD-10-CM | POA: Diagnosis not present

## 2022-07-04 DIAGNOSIS — M47816 Spondylosis without myelopathy or radiculopathy, lumbar region: Secondary | ICD-10-CM | POA: Diagnosis not present

## 2022-07-04 DIAGNOSIS — Z7984 Long term (current) use of oral hypoglycemic drugs: Secondary | ICD-10-CM | POA: Diagnosis not present

## 2022-07-04 DIAGNOSIS — E538 Deficiency of other specified B group vitamins: Secondary | ICD-10-CM | POA: Diagnosis not present

## 2022-07-04 DIAGNOSIS — I10 Essential (primary) hypertension: Secondary | ICD-10-CM | POA: Diagnosis not present

## 2022-07-04 DIAGNOSIS — Z8673 Personal history of transient ischemic attack (TIA), and cerebral infarction without residual deficits: Secondary | ICD-10-CM | POA: Diagnosis not present

## 2022-07-04 DIAGNOSIS — E782 Mixed hyperlipidemia: Secondary | ICD-10-CM | POA: Diagnosis not present

## 2022-07-04 DIAGNOSIS — G8929 Other chronic pain: Secondary | ICD-10-CM | POA: Diagnosis not present

## 2022-07-04 DIAGNOSIS — E119 Type 2 diabetes mellitus without complications: Secondary | ICD-10-CM | POA: Diagnosis not present

## 2022-07-04 DIAGNOSIS — M5136 Other intervertebral disc degeneration, lumbar region: Secondary | ICD-10-CM | POA: Diagnosis not present

## 2022-07-04 DIAGNOSIS — G629 Polyneuropathy, unspecified: Secondary | ICD-10-CM | POA: Diagnosis not present

## 2022-07-10 DIAGNOSIS — M5136 Other intervertebral disc degeneration, lumbar region: Secondary | ICD-10-CM | POA: Diagnosis not present

## 2022-07-10 DIAGNOSIS — M47816 Spondylosis without myelopathy or radiculopathy, lumbar region: Secondary | ICD-10-CM | POA: Diagnosis not present

## 2022-07-10 DIAGNOSIS — Z8673 Personal history of transient ischemic attack (TIA), and cerebral infarction without residual deficits: Secondary | ICD-10-CM | POA: Diagnosis not present

## 2022-07-10 DIAGNOSIS — I10 Essential (primary) hypertension: Secondary | ICD-10-CM | POA: Diagnosis not present

## 2022-07-10 DIAGNOSIS — E119 Type 2 diabetes mellitus without complications: Secondary | ICD-10-CM | POA: Diagnosis not present

## 2022-07-10 DIAGNOSIS — G8929 Other chronic pain: Secondary | ICD-10-CM | POA: Diagnosis not present

## 2022-07-10 DIAGNOSIS — Z7984 Long term (current) use of oral hypoglycemic drugs: Secondary | ICD-10-CM | POA: Diagnosis not present

## 2022-07-10 DIAGNOSIS — E785 Hyperlipidemia, unspecified: Secondary | ICD-10-CM | POA: Diagnosis not present

## 2022-07-10 DIAGNOSIS — Z7982 Long term (current) use of aspirin: Secondary | ICD-10-CM | POA: Diagnosis not present

## 2022-07-21 DIAGNOSIS — I10 Essential (primary) hypertension: Secondary | ICD-10-CM | POA: Diagnosis not present

## 2022-07-21 DIAGNOSIS — Z7984 Long term (current) use of oral hypoglycemic drugs: Secondary | ICD-10-CM | POA: Diagnosis not present

## 2022-07-21 DIAGNOSIS — E119 Type 2 diabetes mellitus without complications: Secondary | ICD-10-CM | POA: Diagnosis not present

## 2022-07-21 DIAGNOSIS — M47816 Spondylosis without myelopathy or radiculopathy, lumbar region: Secondary | ICD-10-CM | POA: Diagnosis not present

## 2022-07-21 DIAGNOSIS — E785 Hyperlipidemia, unspecified: Secondary | ICD-10-CM | POA: Diagnosis not present

## 2022-07-21 DIAGNOSIS — M5136 Other intervertebral disc degeneration, lumbar region: Secondary | ICD-10-CM | POA: Diagnosis not present

## 2022-07-21 DIAGNOSIS — Z8673 Personal history of transient ischemic attack (TIA), and cerebral infarction without residual deficits: Secondary | ICD-10-CM | POA: Diagnosis not present

## 2022-07-21 DIAGNOSIS — Z7982 Long term (current) use of aspirin: Secondary | ICD-10-CM | POA: Diagnosis not present

## 2022-07-21 DIAGNOSIS — G8929 Other chronic pain: Secondary | ICD-10-CM | POA: Diagnosis not present

## 2022-07-25 DIAGNOSIS — Z8673 Personal history of transient ischemic attack (TIA), and cerebral infarction without residual deficits: Secondary | ICD-10-CM | POA: Diagnosis not present

## 2022-07-25 DIAGNOSIS — I10 Essential (primary) hypertension: Secondary | ICD-10-CM | POA: Diagnosis not present

## 2022-07-25 DIAGNOSIS — M47816 Spondylosis without myelopathy or radiculopathy, lumbar region: Secondary | ICD-10-CM | POA: Diagnosis not present

## 2022-07-25 DIAGNOSIS — Z7982 Long term (current) use of aspirin: Secondary | ICD-10-CM | POA: Diagnosis not present

## 2022-07-25 DIAGNOSIS — Z7984 Long term (current) use of oral hypoglycemic drugs: Secondary | ICD-10-CM | POA: Diagnosis not present

## 2022-07-25 DIAGNOSIS — E785 Hyperlipidemia, unspecified: Secondary | ICD-10-CM | POA: Diagnosis not present

## 2022-07-25 DIAGNOSIS — M5136 Other intervertebral disc degeneration, lumbar region: Secondary | ICD-10-CM | POA: Diagnosis not present

## 2022-07-25 DIAGNOSIS — G8929 Other chronic pain: Secondary | ICD-10-CM | POA: Diagnosis not present

## 2022-07-25 DIAGNOSIS — E119 Type 2 diabetes mellitus without complications: Secondary | ICD-10-CM | POA: Diagnosis not present

## 2022-07-26 DIAGNOSIS — Z0000 Encounter for general adult medical examination without abnormal findings: Secondary | ICD-10-CM | POA: Diagnosis not present

## 2022-07-26 DIAGNOSIS — M5416 Radiculopathy, lumbar region: Secondary | ICD-10-CM | POA: Diagnosis not present

## 2022-07-26 DIAGNOSIS — I10 Essential (primary) hypertension: Secondary | ICD-10-CM | POA: Diagnosis not present

## 2022-07-26 DIAGNOSIS — Z780 Asymptomatic menopausal state: Secondary | ICD-10-CM | POA: Diagnosis not present

## 2022-07-26 DIAGNOSIS — Z8673 Personal history of transient ischemic attack (TIA), and cerebral infarction without residual deficits: Secondary | ICD-10-CM | POA: Diagnosis not present

## 2022-07-26 DIAGNOSIS — E782 Mixed hyperlipidemia: Secondary | ICD-10-CM | POA: Diagnosis not present

## 2022-07-26 DIAGNOSIS — E538 Deficiency of other specified B group vitamins: Secondary | ICD-10-CM | POA: Diagnosis not present

## 2022-07-26 DIAGNOSIS — E119 Type 2 diabetes mellitus without complications: Secondary | ICD-10-CM | POA: Diagnosis not present

## 2022-07-26 DIAGNOSIS — R1312 Dysphagia, oropharyngeal phase: Secondary | ICD-10-CM | POA: Diagnosis not present

## 2022-08-08 DIAGNOSIS — N950 Postmenopausal bleeding: Secondary | ICD-10-CM | POA: Diagnosis not present

## 2022-08-08 DIAGNOSIS — N882 Stricture and stenosis of cervix uteri: Secondary | ICD-10-CM | POA: Diagnosis not present

## 2022-08-10 ENCOUNTER — Other Ambulatory Visit: Payer: Self-pay | Admitting: Obstetrics and Gynecology

## 2022-08-17 DIAGNOSIS — N950 Postmenopausal bleeding: Secondary | ICD-10-CM | POA: Diagnosis not present

## 2022-08-17 DIAGNOSIS — Z01818 Encounter for other preprocedural examination: Secondary | ICD-10-CM | POA: Diagnosis not present

## 2022-08-17 NOTE — H&P (Signed)
Chief Complaint:  Kara Rasmussen is a 77 y.o. female here for Pre Op Consulting (Sign consents) . History of Present Illness: Patient presents for a preoperative visit to schedule a D&C, hysteroscopy for PMB. Failed EMBx with ultrasound guidance on 08/08/22.   TVUS 06/2022 Uterus=5.59 x 1.85 x 3.88cm Uterus anteflexed; calcified arcuate arteries noted Fluid filled endometrium=4.74m; walls: 0.16 + 0.18cm=0.24cm(2.437m Rt ovary appears wnl Left ovary appears wnl No free fluid seen   Pertinent Hx: - C/S x 2, Vaginal delivery x 5   Past Medical History:  has a past medical history of Diabetes mellitus type 2, uncomplicated (CMS-HCC), History of stroke, Hyperlipidemia, and Hypertension.  Past Surgical History:  has a past surgical history that includes Colposcopy (05/1988); Cesarean section; Colposcopy; and Colonoscopy (04/23/2019). Family History: family history includes Diabetes type II in her maternal aunt and maternal grandmother; High blood pressure (Hypertension) in her father; No Known Problems in her mother. Social History:  reports that she has never smoked. She has never used smokeless tobacco. She reports that she does not drink alcohol and does not use drugs. OB/GYN History:  OB History    Gravida 9  Para 7  Term 7  Preterm    AB 2  Living 7    SAB    IAB 2  Ectopic    Molar    Multiple    Live Births 7       Allergies: is allergic to procaine. Medications:  Current Outpatient Medications:    alcohol swabs (BD ALCOHOL SWABS) PadM, Apply 200 each topically once daily, Disp: 200 each, Rfl: 1   aspirin 325 MG tablet, Take 325 mg by mouth once daily, Disp: , Rfl:    blood glucose diagnostic test strip, 1 each (1 strip total) 2 (two) times daily Use as instructed., Disp: 200 each, Rfl: 1   blood glucose meter kit, as directed, Disp: 1 each, Rfl: 0   cyanocobalamin (VITAMIN B12) 1000 MCG tablet, Take 1 tablet (1,000 mcg total) by mouth once  daily, Disp: 90 tablet, Rfl: 3   estradioL (ESTRACE) 0.01 % (0.1 mg/gram) vaginal cream, Place 0.5 g vaginally once daily, Disp: 30 g, Rfl: 3   folic acid (FOLVITE) 1 MG tablet, Take 1 tablet (1,000 mcg total) by mouth once daily, Disp: 90 tablet, Rfl: 0   Lactobacillus acidophilus (PROBIOTIC ORAL), Take 1 capsule by mouth once daily, Disp: , Rfl:    lancets, Use 1 each 2 (two) times daily Use as instructed., Disp: 200 each, Rfl: 1   lisinopriL-hydroCHLOROthiazide (ZESTORETIC) 10-12.5 mg tablet, TAKE 1 TABLET EVERY DAY, Disp: 90 tablet, Rfl: 1   metFORMIN (GLUCOPHAGE) 1000 MG tablet, TAKE 1 TABLET TWICE DAILY WITH MEALS, Disp: 180 tablet, Rfl: 1   omeprazole (PRILOSEC) 40 MG DR capsule, Take 1 capsule (40 mg total) by mouth 2 (two) times daily before meals (Patient taking differently: Take 40 mg by mouth 2 (two) times daily before meals Only taking 1 time daily), Disp: 180 capsule, Rfl: 2   pravastatin (PRAVACHOL) 80 MG tablet, TAKE 1 TABLET AT BEDTIME, Disp: 90 tablet, Rfl: 1   blood glucose diagnostic test strip, Use 1 each (1 strip total) once daily One touch. Diagnosis: E11.9, Disp: 100 each, Rfl: 12   blood glucose meter kit, Use as directed One touch. Diagnosis: E11.9, Disp: 1 each, Rfl: 0   conjugated estrogens (PREMARIN) 0.625 mg/gram vaginal cream, Place 0.5 g vaginally nightly for 30 doses Apply: Nightly for 30 days, Disp: 30 g, Rfl:  3  Review of Systems: No SOB, no palpitations or chest pain, no new lower extremity edema, no nausea or vomiting or bowel or bladder complaints. See HPI for gyn specific ROS.   Exam:  BP (!) 148/80   Ht 172.7 cm ('5\' 8"'$ )   Wt 91.3 kg (201 lb 3.2 oz)   BMI 30.59 kg/m   General: Patient is well-groomed, well-nourished, appears stated age in no acute distress   HEENT: head is atraumatic and normocephalic, trachea is midline, neck is supple with no palpable nodules   CV: Regular rhythm and normal heart rate, no murmur   Pulm: Clear to auscultation  throughout lung fields with no wheezing, crackles, or rhonchi. No increased work of breathing  Abdomen: soft , no mass, non-tender, no rebound tenderness, no hepatomegaly  Pelvic: deferred   Impression:  The primary encounter diagnosis was Post-menopausal bleeding. A diagnosis of Preop examination was also pertinent to this visit.  Plan:  1.  Preoperative visit: D&C hysteroscopy. Consents signed today.  -Risks of surgery were discussed with the patient including but not limited to: bleeding which may require transfusion; infection which may require antibiotics; injury to uterus or surrounding organs; intrauterine scarring which may impair future fertility; need for additional procedures including laparotomy or laparoscopy; and other postoperative/anesthesia complications. Written informed consent was obtained.  This is a scheduled same-day surgery. She will have a postop visit in 2 weeks to review operative findings and pathology.  Return for Postop check.

## 2022-08-18 ENCOUNTER — Encounter
Admission: RE | Admit: 2022-08-18 | Discharge: 2022-08-18 | Disposition: A | Discharge status: Home / Self Care | Payer: Medicare HMO | Source: Ambulatory Visit | Attending: Obstetrics and Gynecology | Admitting: Obstetrics and Gynecology

## 2022-08-18 VITALS — Ht 68.0 in | Wt 201.0 lb

## 2022-08-18 DIAGNOSIS — I10 Essential (primary) hypertension: Secondary | ICD-10-CM

## 2022-08-18 DIAGNOSIS — Z01812 Encounter for preprocedural laboratory examination: Secondary | ICD-10-CM

## 2022-08-18 HISTORY — DX: Radiculopathy, lumbar region: M54.16

## 2022-08-18 HISTORY — DX: Postmenopausal bleeding: N95.0

## 2022-08-18 HISTORY — DX: Deficiency of other specified B group vitamins: E53.8

## 2022-08-18 NOTE — Patient Instructions (Addendum)
Your procedure is scheduled on: Thursday, March 21 Report to the Registration Desk on the 1st floor of the [facility]. To find out your arrival time, please call [phone] between 1PM - 3PM on: Wednesday, March 20 If your arrival time is 6:00 am, do not arrive before that time as the [facility] entrance doors do not open until 6:00 am.  REMEMBER: Instructions that are not followed completely may result in serious medical risk, up to and including death; or upon the discretion of your surgeon and anesthesiologist your surgery may need to be rescheduled.  Do not eat food after midnight the night before surgery.  No gum chewing or hard candies.  You may however, drink water up to 2 hours before you are scheduled to arrive for your surgery. Do not drink anything within 2 hours of your scheduled arrival time.  One week prior to surgery: starting today, March 15 Stop aspirin and Anti-inflammatories (NSAIDS) such as Advil, Aleve, Ibuprofen, Motrin, Naproxen, Naprosyn and Aspirin based products such as Excedrin, Goody's Powder, BC Powder. Stop ANY OVER THE COUNTER supplements until after surgery. Stop folic acid, probiotic. You may however, continue to take Tylenol if needed for pain up until the day of surgery.  Continue taking all prescribed medications with the exception of the following:  Metformin - hold 2 days before surgery. Last day to take Metformin is Monday, March 18. Resume AFTER surgery.  TAKE ONLY THESE MEDICATIONS THE MORNING OF SURGERY WITH A SIP OF WATER:  Omeprazole (Prilosec) - (take one the night before and one on the morning of surgery - helps to prevent nausea after surgery.)  No Alcohol for 24 hours before or after surgery.  No Smoking including e-cigarettes for 24 hours before surgery.  No chewable tobacco products for at least 6 hours before surgery.  No nicotine patches on the day of surgery.  Do not use any "recreational" drugs for at least a week  (preferably 2 weeks) before your surgery.  Please be advised that the combination of cocaine and anesthesia may have negative outcomes, up to and including death. If you test positive for cocaine, your surgery will be cancelled.  On the morning of surgery brush your teeth with toothpaste and water, you may rinse your mouth with mouthwash if you wish. Do not swallow any toothpaste or mouthwash.  Do not wear jewelry, make-up, hairpins, clips or nail polish.  Do not wear lotions, powders, or perfumes.   Do not shave body hair from the neck down 48 hours before surgery.  Contact lenses, hearing aids and dentures may not be worn into surgery.  Do not bring valuables to the hospital. [facility] is not responsible for any missing/lost belongings or valuables.   Notify your doctor if there is any change in your medical condition (cold, fever, infection).  Wear comfortable clothing (specific to your surgery type) to the hospital.  After surgery, you can help prevent lung complications by doing breathing exercises.  Take deep breaths and cough every 1-2 hours. Your doctor may order a device called an Incentive Spirometer to help you take deep breaths.  If you are being discharged the day of surgery, you will not be allowed to drive home. You will need a responsible individual to drive you home and stay with you for 24 hours after surgery.   If you are taking public transportation, you will need to have a responsible individual with you.  Please call the [facility] Dept. at [phone]  if you have any questions about these instructions.  Surgery Visitation Policy:  Patients undergoing a surgery or procedure may have two family members or support persons with them as long as the person is not COVID-19 positive or experiencing its symptoms.

## 2022-08-21 ENCOUNTER — Encounter
Admission: RE | Admit: 2022-08-21 | Discharge: 2022-08-21 | Disposition: A | Discharge status: Home / Self Care | Payer: Medicare HMO | Source: Ambulatory Visit | Attending: Obstetrics and Gynecology | Admitting: Obstetrics and Gynecology

## 2022-08-21 DIAGNOSIS — Z01812 Encounter for preprocedural laboratory examination: Secondary | ICD-10-CM

## 2022-08-21 DIAGNOSIS — I10 Essential (primary) hypertension: Secondary | ICD-10-CM

## 2022-08-21 DIAGNOSIS — N950 Postmenopausal bleeding: Secondary | ICD-10-CM | POA: Insufficient documentation

## 2022-08-21 DIAGNOSIS — Z01818 Encounter for other preprocedural examination: Secondary | ICD-10-CM | POA: Insufficient documentation

## 2022-08-21 DIAGNOSIS — R9431 Abnormal electrocardiogram [ECG] [EKG]: Secondary | ICD-10-CM | POA: Diagnosis not present

## 2022-08-21 LAB — BASIC METABOLIC PANEL
Anion gap: 11 (ref 5–15)
BUN: 8 mg/dL (ref 8–23)
CO2: 27 mmol/L (ref 22–32)
Calcium: 9.1 mg/dL (ref 8.9–10.3)
Chloride: 103 mmol/L (ref 98–111)
Creatinine, Ser: 0.78 mg/dL (ref 0.44–1.00)
GFR, Estimated: >60 mL/min (ref >60)
Glucose, Bld: 168 mg/dL — ABNORMAL HIGH (ref 70–99)
Potassium: 3.6 mmol/L (ref 3.5–5.1)
Sodium: 141 mmol/L (ref 135–145)

## 2022-08-21 LAB — CBC
HCT: 35.8 % — ABNORMAL LOW (ref 36.0–46.0)
Hemoglobin: 10.4 g/dL — ABNORMAL LOW (ref 12.0–15.0)
MCH: 20.2 pg — ABNORMAL LOW (ref 26.0–34.0)
MCHC: 29.1 g/dL — ABNORMAL LOW (ref 30.0–36.0)
MCV: 69.5 fL — ABNORMAL LOW (ref 80.0–100.0)
Platelets: 386 10*3/uL (ref 150–400)
RBC: 5.15 MIL/uL — ABNORMAL HIGH (ref 3.87–5.11)
RDW: 17.8 % — ABNORMAL HIGH (ref 11.5–15.5)
WBC: 6.2 10*3/uL (ref 4.0–10.5)
nRBC: 0 % (ref 0.0–0.2)

## 2022-08-24 ENCOUNTER — Encounter: Payer: Self-pay | Admitting: Obstetrics and Gynecology

## 2022-08-24 ENCOUNTER — Encounter
Admission: RE | Disposition: A | Discharge status: Home / Self Care | Payer: Self-pay | Source: Home / Self Care | Attending: Obstetrics and Gynecology

## 2022-08-24 ENCOUNTER — Other Ambulatory Visit: Payer: Self-pay

## 2022-08-24 ENCOUNTER — Ambulatory Visit: Payer: Medicare HMO | Admitting: Urgent Care

## 2022-08-24 ENCOUNTER — Ambulatory Visit: Payer: Medicare HMO | Admitting: Anesthesiology

## 2022-08-24 ENCOUNTER — Ambulatory Visit
Admission: RE | Admit: 2022-08-24 | Discharge: 2022-08-24 | Disposition: A | Discharge status: Home / Self Care | Payer: Medicare HMO | Attending: Obstetrics and Gynecology | Admitting: Obstetrics and Gynecology

## 2022-08-24 DIAGNOSIS — I10 Essential (primary) hypertension: Secondary | ICD-10-CM | POA: Insufficient documentation

## 2022-08-24 DIAGNOSIS — Z87891 Personal history of nicotine dependence: Secondary | ICD-10-CM | POA: Diagnosis not present

## 2022-08-24 DIAGNOSIS — N950 Postmenopausal bleeding: Secondary | ICD-10-CM | POA: Insufficient documentation

## 2022-08-24 DIAGNOSIS — Z09 Encounter for follow-up examination after completed treatment for conditions other than malignant neoplasm: Secondary | ICD-10-CM | POA: Insufficient documentation

## 2022-08-24 DIAGNOSIS — N907 Vulvar cyst: Secondary | ICD-10-CM | POA: Diagnosis not present

## 2022-08-24 DIAGNOSIS — Z7984 Long term (current) use of oral hypoglycemic drugs: Secondary | ICD-10-CM | POA: Insufficient documentation

## 2022-08-24 DIAGNOSIS — E119 Type 2 diabetes mellitus without complications: Secondary | ICD-10-CM | POA: Diagnosis not present

## 2022-08-24 DIAGNOSIS — N898 Other specified noninflammatory disorders of vagina: Secondary | ICD-10-CM | POA: Insufficient documentation

## 2022-08-24 DIAGNOSIS — K219 Gastro-esophageal reflux disease without esophagitis: Secondary | ICD-10-CM | POA: Diagnosis not present

## 2022-08-24 DIAGNOSIS — N882 Stricture and stenosis of cervix uteri: Secondary | ICD-10-CM | POA: Diagnosis not present

## 2022-08-24 DIAGNOSIS — Z01812 Encounter for preprocedural laboratory examination: Secondary | ICD-10-CM

## 2022-08-24 HISTORY — PX: HYSTEROSCOPY WITH D & C: SHX1775

## 2022-08-24 HISTORY — PX: EXCISION VAGINAL CYST: SHX5825

## 2022-08-24 LAB — GLUCOSE, CAPILLARY
Glucose-Capillary: 106 mg/dL — ABNORMAL HIGH (ref 70–99)
Glucose-Capillary: 109 mg/dL — ABNORMAL HIGH (ref 70–99)

## 2022-08-24 SURGERY — DILATATION AND CURETTAGE /HYSTEROSCOPY
Anesthesia: General

## 2022-08-24 MED ORDER — DEXAMETHASONE SODIUM PHOSPHATE 10 MG/ML IJ SOLN
INTRAMUSCULAR | Status: AC
  Filled 2022-08-24: qty 1

## 2022-08-24 MED ORDER — SODIUM CHLORIDE 0.9 % IV SOLN: INTRAVENOUS | Status: DC

## 2022-08-24 MED ORDER — LIDOCAINE-EPINEPHRINE 1 %-1:100000 IJ SOLN
INTRAMUSCULAR | Status: DC | PRN
  Administered 2022-08-24: 3 mL

## 2022-08-24 MED ORDER — OXYCODONE HCL 5 MG/5ML PO SOLN: 5.0000 mg | Freq: Once | ORAL | Status: DC | PRN

## 2022-08-24 MED ORDER — ONDANSETRON HCL 4 MG/2ML IJ SOLN
INTRAMUSCULAR | Status: AC
  Filled 2022-08-24: qty 2

## 2022-08-24 MED ORDER — PHENYLEPHRINE 80 MCG/ML (10ML) SYRINGE FOR IV PUSH (FOR BLOOD PRESSURE SUPPORT)
PREFILLED_SYRINGE | INTRAVENOUS | Status: AC
  Filled 2022-08-24: qty 10

## 2022-08-24 MED ORDER — FENTANYL CITRATE (PF) 100 MCG/2ML IJ SOLN: 25.0000 ug | INTRAMUSCULAR | Status: DC | PRN

## 2022-08-24 MED ORDER — ONDANSETRON HCL 4 MG/2ML IJ SOLN
INTRAMUSCULAR | Status: DC | PRN
  Administered 2022-08-24: 4 mg via INTRAVENOUS

## 2022-08-24 MED ORDER — PROPOFOL 10 MG/ML IV BOLUS
INTRAVENOUS | Status: DC | PRN
  Administered 2022-08-24: 130 mg via INTRAVENOUS

## 2022-08-24 MED ORDER — IBUPROFEN 800 MG PO TABS: 800.0000 mg | ORAL_TABLET | Freq: Three times a day (TID) | ORAL | 0 refills | Status: AC | PRN

## 2022-08-24 MED ORDER — LIDOCAINE HCL (CARDIAC) PF 100 MG/5ML IV SOSY
PREFILLED_SYRINGE | INTRAVENOUS | Status: DC | PRN
  Administered 2022-08-24: 40 mg via INTRAVENOUS

## 2022-08-24 MED ORDER — ORAL CARE MOUTH RINSE: 15.0000 mL | Freq: Once | OROMUCOSAL | Status: AC

## 2022-08-24 MED ORDER — MIDAZOLAM HCL 2 MG/2ML IJ SOLN
INTRAMUSCULAR | Status: DC | PRN
  Administered 2022-08-24: 1 mg via INTRAVENOUS

## 2022-08-24 MED ORDER — FENTANYL CITRATE (PF) 100 MCG/2ML IJ SOLN
INTRAMUSCULAR | Status: AC
  Filled 2022-08-24: qty 2

## 2022-08-24 MED ORDER — ACETAMINOPHEN 10 MG/ML IV SOLN
INTRAVENOUS | Status: DC | PRN
  Administered 2022-08-24: 1000 mg via INTRAVENOUS

## 2022-08-24 MED ORDER — LIDOCAINE-EPINEPHRINE 1 %-1:100000 IJ SOLN
INTRAMUSCULAR | Status: AC
  Filled 2022-08-24: qty 1

## 2022-08-24 MED ORDER — KETOROLAC TROMETHAMINE 30 MG/ML IJ SOLN
INTRAMUSCULAR | Status: DC | PRN
  Administered 2022-08-24: 15 mg via INTRAVENOUS

## 2022-08-24 MED ORDER — LACTATED RINGERS IV SOLN: INTRAVENOUS | Status: DC

## 2022-08-24 MED ORDER — MIDAZOLAM HCL 2 MG/2ML IJ SOLN
INTRAMUSCULAR | Status: AC
  Filled 2022-08-24: qty 2

## 2022-08-24 MED ORDER — PHENYLEPHRINE HCL (PRESSORS) 10 MG/ML IV SOLN
INTRAVENOUS | Status: DC | PRN
  Administered 2022-08-24: 80 ug via INTRAVENOUS

## 2022-08-24 MED ORDER — SILVER NITRATE-POT NITRATE 75-25 % EX MISC
CUTANEOUS | Status: DC | PRN
  Administered 2022-08-24: 4 via TOPICAL

## 2022-08-24 MED ORDER — CHLORHEXIDINE GLUCONATE 0.12 % MT SOLN
OROMUCOSAL | Status: AC
  Administered 2022-08-24: 15 mL via OROMUCOSAL
  Filled 2022-08-24: qty 15

## 2022-08-24 MED ORDER — CHLORHEXIDINE GLUCONATE 0.12 % MT SOLN: 15.0000 mL | Freq: Once | OROMUCOSAL | Status: AC

## 2022-08-24 MED ORDER — POVIDONE-IODINE 10 % EX SWAB: 2.0000 | Freq: Once | CUTANEOUS | Status: DC

## 2022-08-24 MED ORDER — ACETAMINOPHEN 10 MG/ML IV SOLN
INTRAVENOUS | Status: AC
  Filled 2022-08-24: qty 100

## 2022-08-24 MED ORDER — 0.9 % SODIUM CHLORIDE (POUR BTL) OPTIME
TOPICAL | Status: DC | PRN
  Administered 2022-08-24: 370 mL

## 2022-08-24 MED ORDER — DEXAMETHASONE SODIUM PHOSPHATE 10 MG/ML IJ SOLN
INTRAMUSCULAR | Status: DC | PRN
  Administered 2022-08-24: 5 mg via INTRAVENOUS

## 2022-08-24 MED ORDER — OXYCODONE HCL 5 MG PO TABS: 5.0000 mg | ORAL_TABLET | Freq: Once | ORAL | Status: DC | PRN

## 2022-08-24 MED ORDER — PROPOFOL 10 MG/ML IV BOLUS
INTRAVENOUS | Status: AC
  Filled 2022-08-24: qty 20

## 2022-08-24 MED ORDER — FENTANYL CITRATE (PF) 100 MCG/2ML IJ SOLN
INTRAMUSCULAR | Status: DC | PRN
  Administered 2022-08-24 (×2): 50 ug via INTRAVENOUS
  Administered 2022-08-24 (×2): 25 ug via INTRAVENOUS

## 2022-08-24 MED ORDER — KETOROLAC TROMETHAMINE 30 MG/ML IJ SOLN
INTRAMUSCULAR | Status: AC
  Filled 2022-08-24: qty 1

## 2022-08-24 SURGICAL SUPPLY — 25 items
BAG PRESSURE INF REUSE 1000 (BAG) ×2 IMPLANT
BASIN KIT SINGLE STR (MISCELLANEOUS) ×2 IMPLANT
CURETTE PIPELLE ENDOMTRL SUCTN (MISCELLANEOUS) IMPLANT
DRSG TELFA 3X8 NADH STRL (GAUZE/BANDAGES/DRESSINGS) IMPLANT
ELECT REM PT RETURN 9FT ADLT (ELECTROSURGICAL) ×2
ELECTRODE REM PT RTRN 9FT ADLT (ELECTROSURGICAL) ×2 IMPLANT
GAUZE 4X4 16PLY ~~LOC~~+RFID DBL (SPONGE) IMPLANT
GLOVE BIO SURGEON STRL SZ7 (GLOVE) ×2 IMPLANT
GLOVE INDICATOR 7.5 STRL GRN (GLOVE) ×2 IMPLANT
GOWN STRL REUS W/ TWL LRG LVL3 (GOWN DISPOSABLE) ×4 IMPLANT
GOWN STRL REUS W/TWL LRG LVL3 (GOWN DISPOSABLE) ×4
IV NS IRRIG 3000ML ARTHROMATIC (IV SOLUTION) ×2 IMPLANT
KIT PROCEDURE FLUENT (KITS) ×2 IMPLANT
KIT TURNOVER CYSTO (KITS) ×2 IMPLANT
MANIFOLD NEPTUNE II (INSTRUMENTS) ×2 IMPLANT
PACK DNC HYST (MISCELLANEOUS) ×2 IMPLANT
PAD PREP 24X41 OB/GYN DISP (PERSONAL CARE ITEMS) ×2 IMPLANT
PIPELLE ENDOMETRIAL SUCTION CU (MISCELLANEOUS) ×2
SCRUB CHG 4% DYNA-HEX 4OZ (MISCELLANEOUS) ×2 IMPLANT
SEAL ROD LENS SCOPE MYOSURE (ABLATOR) ×2 IMPLANT
SET CYSTO W/LG BORE CLAMP LF (SET/KITS/TRAYS/PACK) IMPLANT
SUT MON AB 3-0 SH 27 (SUTURE) IMPLANT
TRAP FLUID SMOKE EVACUATOR (MISCELLANEOUS) ×2 IMPLANT
TUBING CONNECTING 10 (TUBING) ×2 IMPLANT
WATER STERILE IRR 500ML POUR (IV SOLUTION) ×2 IMPLANT

## 2022-08-24 NOTE — Interval H&P Note (Signed)
History and Physical Interval Note:  08/24/2022 3:26 PM  Kara Rasmussen  has presented today for surgery, with the diagnosis of postmenopausal bleeding, anteflexed uterus w/ cervical stenosis.  The various methods of treatment have been discussed with the patient and family. After consideration of risks, benefits and other options for treatment, the patient has consented to  Procedure(s): DILATATION AND CURETTAGE /HYSTEROSCOPY (N/A) as a surgical intervention.  The patient's history has been reviewed, patient examined, no change in status, stable for surgery.  I have reviewed the patient's chart and labs.  Questions were answered to the patient's satisfaction.     Benjaman Kindler

## 2022-08-24 NOTE — Discharge Instructions (Addendum)
Discharge instructions after a hysteroscopy with dilation and curettage  Signs and Symptoms to Report  Call our office at [phone] if you have any of the following:    Fever over 100.4 degrees or higher  Severe stomach pain not relieved with pain medications  Bright red bleeding that's heavier than a period that does not slow with rest after the first 24 hours  To go the bathroom a lot (frequency), you can't hold your urine (urgency), or it hurts when you empty your bladder (urinate)  Chest pain  Shortness of breath  Pain in the calves of your legs  Severe nausea and vomiting not relieved with anti-nausea medications  Any concerns  What You Can Expect after Surgery  You may see some pink tinged, bloody fluid. This is normal. You may also have cramping for several days.   Activities after Your Discharge Follow these guidelines to help speed your recovery at home:  Don't drive if you are in pain or taking narcotic pain medicine. You may drive when you can safely slam on the brakes, turn the wheel forcefully, and rotate your torso comfortably. This is typically 4-7 days. Practice in a parking lot or side street prior to attempting to drive regularly.   Ask others to help with household chores for 4 weeks.  Don't do strenuous activities, exercises, or sports like vacuuming, tennis, squash, etc. until your doctor says it is safe to do so.  Walk as you feel able. Rest often since it may take a week or two for your energy level to return to normal.   You may climb stairs  Avoid constipation:   -Eat fruits, vegetables, and whole grains. Eat small meals as your appetite will take time to return to normal.   -Drink 6 to 8 glasses of water each day unless your doctor has told you to limit your fluids.   -Use a laxative or stool softener as needed if constipation becomes a problem. You may take Miralax, metamucil, Citrucil, Colace, Senekot, FiberCon, etc. If this does not relieve the  constipation, try two tablespoons of Milk Of Magnesia every 8 hours until your bowels move.   You may shower.   Do not get in a hot tub, swimming pool, etc. until your doctor agrees.  Do not douche, use tampons, or have sex until your doctor says it is okay, usually about 2 weeks.  Take your pain medicine when you need it. The medicine may not work as well if the pain is bad.  Take the medicines you were taking before surgery. Other medications you might need are pain medications (ibuprofen), medications for constipation (Colace) and nausea medications (Zofran).      AMBULATORY SURGERY  DISCHARGE INSTRUCTIONS   The drugs that you were given will stay in your system until tomorrow so for the next 24 hours you should not:  Drive an automobile Make any legal decisions Drink any alcoholic beverage   You may resume regular meals tomorrow.  Today it is better to start with liquids and gradually work up to solid foods.  You may eat anything you prefer, but it is better to start with liquids, then soup and crackers, and gradually work up to solid foods.   Please notify your doctor immediately if you have any unusual bleeding, trouble breathing, redness and pain at the surgery site, drainage, fever, or pain not relieved by medication.    Additional Instructions:        Please contact your physician   with any problems or Same Day Surgery at [phone], Monday through Friday 6 am to 4 pm, or [facility] [place] [facility] number at [phone]. 

## 2022-08-24 NOTE — Transfer of Care (Signed)
Immediate Anesthesia Transfer of Care Note  Patient: Kara Rasmussen  Procedure(s) Performed: DILATATION AND CURETTAGE /HYSTEROSCOPY  Patient Location: PACU  Anesthesia Type:General  Level of Consciousness: awake and oriented  Airway & Oxygen Therapy: Patient Spontanous Breathing and Patient connected to face mask oxygen  Post-op Assessment: Report given to RN and Post -op Vital signs reviewed and stable  Post vital signs: Reviewed and stable  Last Vitals:  Vitals Value Taken Time  BP 136/74 08/24/22 1653  Temp 36.4 C 08/24/22 1653  Pulse 80 08/24/22 1657  Resp 23 08/24/22 1657  SpO2 100 % 08/24/22 1657  Vitals shown include unvalidated device data.  Last Pain:  Vitals:   08/24/22 1503  TempSrc: Oral  PainSc: 0-No pain         Complications: No notable events documented.

## 2022-08-24 NOTE — Anesthesia Procedure Notes (Signed)
Procedure Name: LMA Insertion Date/Time: 08/24/2022 3:53 PM  Performed by: Levin Erp, CRNAPre-anesthesia Checklist: Patient identified, Emergency Drugs available, Suction available, Patient being monitored and Timeout performed Patient Re-evaluated:Patient Re-evaluated prior to induction Oxygen Delivery Method: Circle system utilized Preoxygenation: Pre-oxygenation with 100% oxygen Induction Type: IV induction LMA: LMA inserted LMA Size: 4.0 Number of attempts: 1

## 2022-08-24 NOTE — Op Note (Signed)
Operative Report Hysteroscopy with Dilation and Curettage   Indications:  Postmenopausal bleeding  Pre-operative Diagnosis: Cervical stenosis   Post-operative Diagnosis: same. + right vaginal cyst  Procedure: 1. Exam under anesthesia 2. Fractional D&C 3. Hysteroscopy 4. Right vaginal cyst excision  Surgeon: Benjaman Kindler, MD  Assistant(s):  None  Anesthesia: General LMA anesthesia  Anesthesiologist: Piscitello, Precious Haws, MD Anesthesiologist: Amie Critchley Precious Haws, MD CRNA: Levin Erp, CRNA  Estimated Blood Loss:   48ml  Total IV Fluids: 566ml  Urine Output: 86ml  Total Fluid Deficit:  n/a          Specimens: Endocervical curettings, endometrial curettings, right vaginal cyst         Complications:  None; patient tolerated the procedure well.         Disposition: PACU - hemodynamically stable.         Condition: stable  Findings: tiny attenuated cervix flush with the vaginal wall, but palpable. Unusual endometrial cavity shape and very small, total length measuring 4 cm. Prior cervical length measured <1cm in 2016. Endometrial cavity small with possible septum in the middle. These areas were sampled.  On the right vaginal wall, distal to the hymen in the forchette, at about 9 o'clock, she had a 1cm cyst like structure with dark contents and areas of pale firm tissue. This was excised entirely and dark "chocolate" fluid expressed.  Indication for procedure/Consents: 77 y.o. G7 here for scheduled surgery for the aforementioned diagnoses of persistent PMB and cervical stenosis from likely cone bx in the past (approx 1989). Prior d&c's have been unsuccessful but she is willing to try again. U/s this time showed a 4.56mm ES with fluid filled, measuring a total of 5.6cm.  Risks of surgery were discussed with the patient including but not limited to: bleeding which may require transfusion; infection which may require antibiotics; injury to uterus or surrounding organs;  intrauterine scarring which may impair future fertility; need for additional procedures including laparotomy or laparoscopy; and other postoperative/anesthesia complications. Written informed consent was obtained.    Procedure Details:  Fractional D&C only  The patient was taken to the operating room where anesthesia was administered and was found to be adequate.  After a formal and adequate timeout was performed, she was placed in the dorsal lithotomy position and examined with the above findings. She was then prepped and draped in the sterile manner.   Her bladder was catheterized for an estimated amount of clear, yellow urine. A bivalve speculum was then placed in the patient's vagina and a single tooth tenaculum was applied to the anterior lip of the cervix by feel.  Her cervix was serially dilated only 4cm deep to 15 [place] using Hanks dilators.  An ECC was performed. The hysteroscope was introduced to reveal the above findings, and followed the curve of the pathway to normal endometrial tissue, though the shape of the cavity was entirely tiny and with a septum. A sharp curettage was then performed until there was a gritty texture in all four quadrants.  The tenaculum was removed from the anterior lip of the cervix and the vaginal speculum was removed with good hemostasis.   A 15-blade was used to excise the vaginal wall cyst in an elliptical shape and closed with sub-q running using a 3-0 monocryl on an SH needle, and 3cc of 1:100000 lid with epi injected for pain control. Pressure held to assure hemostasis.  The patient tolerated the procedure well and was taken to the recovery area awake and  in stable condition. She received iv acetaminophen and Toradol prior to leaving the OR.  The patient will be discharged to home as per PACU criteria. Routine postoperative instructions given.  She was prescribed Ibuprofen and Colace.  She will follow up in the clinic in two weeks for postoperative  evaluation.

## 2022-08-24 NOTE — Anesthesia Preprocedure Evaluation (Addendum)
Anesthesia Evaluation  Patient identified by MRN, date of birth, ID band Patient awake    Reviewed: Allergy & Precautions, NPO status , Patient's Chart, lab work & pertinent test results  History of Anesthesia Complications Negative for: history of anesthetic complications  Airway Mallampati: III  TM Distance: >3 FB Neck ROM: full    Dental  (+) Chipped, Poor Dentition, Missing, Partial Upper   Pulmonary neg shortness of breath, former smoker   Pulmonary exam normal        Cardiovascular hypertension, (-) angina (-) Past MI Normal cardiovascular exam     Neuro/Psych  Neuromuscular disease CVA  negative psych ROS   GI/Hepatic negative GI ROS, Neg liver ROS,,,  Endo/Other  diabetes    Renal/GU      Musculoskeletal   Abdominal   Peds  Hematology negative hematology ROS (+)   Anesthesia Other Findings Past Medical History: No date: B12 deficiency No date: Bilateral foot pain No date: 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 No date: GERD (gastroesophageal reflux disease) No date: Hypercholesterolemia No date: Hypertension No date: Lumbar radiculopathy No date: Mixed hyperlipidemia No date: Nonspecific low back pain No date: Post-menopausal bleeding 11/18/2018: Stroke [facility])     Comment:  left MCA territory infarct  Past Surgical History: 1990: CESAREAN SECTION 1981: CESAREAN SECTION 04/23/2019: COLONOSCOPY WITH PROPOFOL; N/A     Comment:  Procedure: COLONOSCOPY WITH PROPOFOL;  Surgeon: [place],               Benay Pike, MD;  Location: [facility] ENDOSCOPY;  Service:               Gastroenterology;  Laterality: N/A; No date: COLPOSCOPY  BMI    Body Mass Index: 30.57 kg/m      Reproductive/Obstetrics negative OB ROS                             Anesthesia Physical Anesthesia Plan  ASA: 3  Anesthesia Plan: General LMA   Post-op Pain Management:     Induction: Intravenous  PONV Risk Score and Plan: Dexamethasone, Ondansetron, Midazolam and Treatment may vary due to age or medical condition  Airway Management Planned: LMA  Additional Equipment:   Intra-op Plan:   Post-operative Plan: Extubation in OR  Informed Consent: I have reviewed the patients History and Physical, chart, labs and discussed the procedure including the risks, benefits and alternatives for the proposed anesthesia with the patient or authorized representative who has indicated his/her understanding and acceptance.     Dental Advisory Given  Plan Discussed with: Anesthesiologist, CRNA and Surgeon  Anesthesia Plan Comments: (Patients reported reaction to procaine sounds like an intravascular injection at the dentist office and not an allergic reaction so we plan to expose the patient to local anesthetics as she also says she has had exposure since then without any problems.   Patient voiced assent to this plan.  Patient consented for risks of anesthesia including but not limited to:  - adverse reactions to medications - damage to eyes, teeth, lips or other oral mucosa - nerve damage due to positioning  - sore throat or hoarseness - Damage to heart, brain, nerves, lungs, other parts of body or loss of life  Patient voiced understanding.)       Anesthesia Quick Evaluation

## 2022-08-25 ENCOUNTER — Encounter: Payer: Self-pay | Admitting: Obstetrics and Gynecology

## 2022-08-25 NOTE — Anesthesia Postprocedure Evaluation (Signed)
Anesthesia Post Note  Patient: Kara Rasmussen  Procedure(s) Performed: DILATATION AND CURETTAGE /HYSTEROSCOPY EXCISION VAGINAL CYST  Patient location during evaluation: PACU Anesthesia Type: General Level of consciousness: awake and alert Pain management: pain level controlled Vital Signs Assessment: post-procedure vital signs reviewed and stable Respiratory status: spontaneous breathing, nonlabored ventilation, respiratory function stable and patient connected to nasal cannula oxygen Cardiovascular status: blood pressure returned to baseline and stable Postop Assessment: no apparent nausea or vomiting Anesthetic complications: no   No notable events documented.   Last Vitals:  Vitals:   08/24/22 1730 08/24/22 1740  BP: 137/72 (!) 158/94  Pulse: 80 89  Resp: 12   Temp: 36.4 C (!) 36.1 C  SpO2: 94% 92%    Last Pain:  Vitals:   08/24/22 1740  TempSrc: Temporal  PainSc: 0-No pain                 Precious Haws Gyasi Hazzard

## 2022-08-28 LAB — SURGICAL PATHOLOGY

## 2022-09-04 ENCOUNTER — Encounter: Payer: Self-pay | Admitting: Obstetrics and Gynecology

## 2022-09-06 ENCOUNTER — Other Ambulatory Visit: Payer: Self-pay | Admitting: Obstetrics and Gynecology

## 2022-09-06 DIAGNOSIS — N950 Postmenopausal bleeding: Secondary | ICD-10-CM

## 2022-09-18 ENCOUNTER — Ambulatory Visit: Payer: Medicare HMO | Admitting: *Deleted

## 2022-09-27 ENCOUNTER — Encounter: Payer: Self-pay | Admitting: Obstetrics and Gynecology

## 2022-09-28 ENCOUNTER — Ambulatory Visit
Admission: RE | Admit: 2022-09-28 | Discharge: 2022-09-28 | Disposition: A | Discharge status: Home / Self Care | Payer: Medicare HMO | Source: Ambulatory Visit | Attending: Obstetrics and Gynecology | Admitting: Obstetrics and Gynecology

## 2022-09-28 DIAGNOSIS — N950 Postmenopausal bleeding: Secondary | ICD-10-CM | POA: Diagnosis not present

## 2022-09-28 MED ORDER — GADOPICLENOL 0.5 MMOL/ML IV SOLN
9.0000 mL | Freq: Once | INTRAVENOUS | Status: AC | PRN
  Administered 2022-09-28: 9 mL via INTRAVENOUS

## 2022-10-04 ENCOUNTER — Ambulatory Visit: Payer: Medicare HMO | Admitting: Skilled Nursing Facility1

## 2022-10-20 DIAGNOSIS — R1314 Dysphagia, pharyngoesophageal phase: Secondary | ICD-10-CM | POA: Diagnosis not present

## 2022-10-20 DIAGNOSIS — R6889 Other general symptoms and signs: Secondary | ICD-10-CM | POA: Diagnosis not present

## 2022-10-20 DIAGNOSIS — K219 Gastro-esophageal reflux disease without esophagitis: Secondary | ICD-10-CM | POA: Diagnosis not present

## 2022-10-20 DIAGNOSIS — R053 Chronic cough: Secondary | ICD-10-CM | POA: Diagnosis not present

## 2022-10-27 DIAGNOSIS — N950 Postmenopausal bleeding: Secondary | ICD-10-CM | POA: Diagnosis not present

## 2023-01-25 DIAGNOSIS — R1312 Dysphagia, oropharyngeal phase: Secondary | ICD-10-CM | POA: Diagnosis not present

## 2023-01-25 DIAGNOSIS — I10 Essential (primary) hypertension: Secondary | ICD-10-CM | POA: Diagnosis not present

## 2023-01-25 DIAGNOSIS — E119 Type 2 diabetes mellitus without complications: Secondary | ICD-10-CM | POA: Diagnosis not present

## 2023-01-25 DIAGNOSIS — E782 Mixed hyperlipidemia: Secondary | ICD-10-CM | POA: Diagnosis not present

## 2023-01-25 DIAGNOSIS — Z1231 Encounter for screening mammogram for malignant neoplasm of breast: Secondary | ICD-10-CM | POA: Diagnosis not present

## 2023-01-25 DIAGNOSIS — R6889 Other general symptoms and signs: Secondary | ICD-10-CM | POA: Diagnosis not present

## 2023-01-25 DIAGNOSIS — Z748 Other problems related to care provider dependency: Secondary | ICD-10-CM | POA: Diagnosis not present

## 2023-01-25 DIAGNOSIS — N938 Other specified abnormal uterine and vaginal bleeding: Secondary | ICD-10-CM | POA: Diagnosis not present

## 2023-01-25 DIAGNOSIS — Z8673 Personal history of transient ischemic attack (TIA), and cerebral infarction without residual deficits: Secondary | ICD-10-CM | POA: Diagnosis not present

## 2023-02-07 ENCOUNTER — Other Ambulatory Visit: Payer: Self-pay | Admitting: Physician Assistant

## 2023-02-07 DIAGNOSIS — Z1231 Encounter for screening mammogram for malignant neoplasm of breast: Secondary | ICD-10-CM

## 2023-02-15 ENCOUNTER — Ambulatory Visit
Admission: RE | Admit: 2023-02-15 | Discharge: 2023-02-15 | Disposition: A | Discharge status: Home / Self Care | Payer: Medicare HMO | Source: Ambulatory Visit | Attending: Physician Assistant | Admitting: Physician Assistant

## 2023-02-15 DIAGNOSIS — R6889 Other general symptoms and signs: Secondary | ICD-10-CM | POA: Diagnosis not present

## 2023-02-15 DIAGNOSIS — Z1231 Encounter for screening mammogram for malignant neoplasm of breast: Secondary | ICD-10-CM | POA: Insufficient documentation

## 2023-04-09 DIAGNOSIS — N950 Postmenopausal bleeding: Secondary | ICD-10-CM | POA: Diagnosis not present

## 2023-04-09 DIAGNOSIS — R6889 Other general symptoms and signs: Secondary | ICD-10-CM | POA: Diagnosis not present

## 2023-04-09 DIAGNOSIS — E119 Type 2 diabetes mellitus without complications: Secondary | ICD-10-CM | POA: Diagnosis not present

## 2023-04-09 DIAGNOSIS — R8290 Unspecified abnormal findings in urine: Secondary | ICD-10-CM | POA: Diagnosis not present

## 2023-04-10 DIAGNOSIS — R8290 Unspecified abnormal findings in urine: Secondary | ICD-10-CM | POA: Diagnosis not present

## 2023-04-19 DIAGNOSIS — R6889 Other general symptoms and signs: Secondary | ICD-10-CM | POA: Diagnosis not present

## 2023-05-01 DIAGNOSIS — R6889 Other general symptoms and signs: Secondary | ICD-10-CM | POA: Diagnosis not present

## 2024-01-14 ENCOUNTER — Other Ambulatory Visit: Payer: Self-pay | Admitting: Physician Assistant

## 2024-01-14 DIAGNOSIS — Z1231 Encounter for screening mammogram for malignant neoplasm of breast: Secondary | ICD-10-CM

## 2024-02-05 ENCOUNTER — Other Ambulatory Visit: Payer: Self-pay

## 2024-02-05 ENCOUNTER — Ambulatory Visit
Admission: RE | Admit: 2024-02-05 | Discharge: 2024-02-05 | Disposition: A | Discharge status: Home / Self Care | Attending: Internal Medicine | Admitting: Internal Medicine

## 2024-02-05 ENCOUNTER — Ambulatory Visit: Admitting: Certified Registered Nurse Anesthetist

## 2024-02-05 ENCOUNTER — Encounter
Admission: RE | Disposition: A | Discharge status: Home / Self Care | Payer: Self-pay | Source: Home / Self Care | Attending: Internal Medicine

## 2024-02-05 DIAGNOSIS — E119 Type 2 diabetes mellitus without complications: Secondary | ICD-10-CM | POA: Insufficient documentation

## 2024-02-05 DIAGNOSIS — K5730 Diverticulosis of large intestine without perforation or abscess without bleeding: Secondary | ICD-10-CM | POA: Diagnosis not present

## 2024-02-05 DIAGNOSIS — K640 First degree hemorrhoids: Secondary | ICD-10-CM | POA: Diagnosis not present

## 2024-02-05 DIAGNOSIS — D509 Iron deficiency anemia, unspecified: Secondary | ICD-10-CM | POA: Insufficient documentation

## 2024-02-05 DIAGNOSIS — Z87891 Personal history of nicotine dependence: Secondary | ICD-10-CM | POA: Diagnosis not present

## 2024-02-05 DIAGNOSIS — Z79899 Other long term (current) drug therapy: Secondary | ICD-10-CM | POA: Insufficient documentation

## 2024-02-05 DIAGNOSIS — K219 Gastro-esophageal reflux disease without esophagitis: Secondary | ICD-10-CM | POA: Insufficient documentation

## 2024-02-05 DIAGNOSIS — G709 Myoneural disorder, unspecified: Secondary | ICD-10-CM | POA: Insufficient documentation

## 2024-02-05 DIAGNOSIS — I10 Essential (primary) hypertension: Secondary | ICD-10-CM | POA: Insufficient documentation

## 2024-02-05 DIAGNOSIS — Z7984 Long term (current) use of oral hypoglycemic drugs: Secondary | ICD-10-CM | POA: Insufficient documentation

## 2024-02-05 DIAGNOSIS — Z7982 Long term (current) use of aspirin: Secondary | ICD-10-CM | POA: Insufficient documentation

## 2024-02-05 DIAGNOSIS — Z8673 Personal history of transient ischemic attack (TIA), and cerebral infarction without residual deficits: Secondary | ICD-10-CM | POA: Insufficient documentation

## 2024-02-05 HISTORY — PX: COLONOSCOPY: SHX5424

## 2024-02-05 LAB — GLUCOSE, CAPILLARY: Glucose-Capillary: 170 mg/dL — ABNORMAL HIGH (ref 70–99)

## 2024-02-05 SURGERY — COLONOSCOPY
Anesthesia: General

## 2024-02-05 MED ORDER — PROPOFOL 10 MG/ML IV BOLUS
INTRAVENOUS | Status: DC | PRN
  Administered 2024-02-05: 70 mg via INTRAVENOUS

## 2024-02-05 MED ORDER — GLYCOPYRROLATE 0.2 MG/ML IJ SOLN
INTRAMUSCULAR | Status: AC
  Filled 2024-02-05: qty 1

## 2024-02-05 MED ORDER — PROPOFOL 1000 MG/100ML IV EMUL
INTRAVENOUS | Status: AC
  Filled 2024-02-05: qty 300

## 2024-02-05 MED ORDER — SODIUM CHLORIDE 0.9 % IV SOLN: INTRAVENOUS | Status: DC

## 2024-02-05 MED ORDER — PROPOFOL 500 MG/50ML IV EMUL
INTRAVENOUS | Status: DC | PRN
  Administered 2024-02-05: 160 ug/kg/min via INTRAVENOUS

## 2024-02-05 NOTE — Transfer of Care (Signed)
 Immediate Anesthesia Transfer of Care Note  Patient: Kara Rasmussen  Procedure(s) Performed: COLONOSCOPY  Patient Location: PACU  Anesthesia Type:General  Level of Consciousness: drowsy  Airway & Oxygen Therapy: Patient Spontanous Breathing  Post-op Assessment: Report given to RN and Post -op Vital signs reviewed and stable  Post vital signs: Reviewed and stable  Last Vitals:  Vitals Value Taken Time  BP 109/63 02/05/24 08:39  Temp    Pulse 82 02/05/24 08:40  Resp 19 02/05/24 08:40  SpO2 95 % 02/05/24 08:40  Vitals shown include unfiled device data.  Last Pain:  Vitals:   02/05/24 0726  TempSrc: Tympanic         Complications: No notable events documented.

## 2024-02-05 NOTE — Anesthesia Postprocedure Evaluation (Signed)
 Anesthesia Post Note  Patient: Kara Rasmussen  Procedure(s) Performed: COLONOSCOPY  Patient location during evaluation: PACU Anesthesia Type: General Level of consciousness: awake and alert Pain management: pain level controlled Vital Signs Assessment: post-procedure vital signs reviewed and stable Respiratory status: spontaneous breathing, nonlabored ventilation, respiratory function stable and patient connected to nasal cannula oxygen Cardiovascular status: blood pressure returned to baseline and stable Postop Assessment: no apparent nausea or vomiting Anesthetic complications: no   No notable events documented.   Last Vitals:  Vitals:   02/05/24 0726 02/05/24 0840  BP: (!) 152/110 109/63  Pulse: 86   Resp: 16   Temp: (!) 35.2 C (!) 35.8 C    Last Pain:  Vitals:   02/05/24 0840  TempSrc: Temporal                 Lynwood KANDICE Clause

## 2024-02-05 NOTE — Interval H&P Note (Signed)
 History and Physical Interval Note:  02/05/2024 8:12 AM  Kara Rasmussen  has presented today for surgery, with the diagnosis of Iron deficiency anemia, unspecified iron deficiency anemia type (D50.9).  The various methods of treatment have been discussed with the patient and family. After consideration of risks, benefits and other options for treatment, the patient has consented to  Procedure(s): COLONOSCOPY (N/A) as a surgical intervention.  The patient's history has been reviewed, patient examined, no change in status, stable for surgery.  I have reviewed the patient's chart and labs.  Questions were answered to the patient's satisfaction.     [place], Adalis Gatti

## 2024-02-05 NOTE — Anesthesia Procedure Notes (Signed)
 Date/Time: 02/05/2024 8:20 AM  Performed by: Duwayne Craven, CRNAPre-anesthesia Checklist: Patient identified, Emergency Drugs available, Suction available, Patient being monitored and Timeout performed Patient Re-evaluated:Patient Re-evaluated prior to induction Oxygen Delivery Method: Nasal cannula Induction Type: IV induction Placement Confirmation: CO2 detector and positive ETCO2

## 2024-02-05 NOTE — H&P (Signed)
 Outpatient short stay form Pre-procedure 02/05/2024 8:10 AM Kara Rasmussen K. Aundria, M.D.  Primary Physician: Kara Crimes, PA  Reason for visit:  Iron deficiency anemia  History of present illness:  Iron deficiency anemia-most recent hemoglobin 9.4 with MCV 65.1. Her iron studies and ferritin were low in 03/21/22. She was recommended to have endoscopy colonoscopy at that time and declined and start iron in 2023. Does not appear she has started iron supplement. Will repeat labs today but reviewed will need endoscopy colonoscopy for further evaluation. She denies any prior issues with sedation. She does take aspirin  325 mg daily but is on PPI twice daily.  GERD-not feeling classic reflux symptoms on omeprazole 40 mg twice daily. She does complain of vibrations in her esophagus and stomach area although these do not relate to eating and does not feel any classic burning, regurgitation etc. Can evaluate w/ EGD. Denies dysphagia.    Current Facility-Administered Medications:    0.9 %  sodium chloride  infusion, , Intravenous, Continuous, Hubbard, Clennon Nasca K, MD, Last Rate: 20 mL/hr at 02/05/24 0753, Continued from Pre-op at 02/05/24 0753  Medications Prior to Admission  Medication Sig Dispense Refill Last Dose/Taking   aspirin  325 MG tablet Take 325 mg by mouth daily.   02/04/2024 Morning   cyanocobalamin  (VITAMIN B12) 1000 MCG tablet Take 1,000 mcg by mouth daily.      estradiol (ESTRACE) 0.1 MG/GM vaginal cream Place 1 Applicatorful vaginally at bedtime.      folic acid  (FOLVITE ) 1 MG tablet Take 1 mg by mouth daily.       lisinopril -hydrochlorothiazide  (ZESTORETIC ) 10-12.5 MG tablet Take 1 tablet by mouth daily.      metFORMIN (GLUCOPHAGE) 1000 MG tablet Take 1,000 mg by mouth 2 (two) times daily.      omeprazole (PRILOSEC) 40 MG capsule Take 40 mg by mouth in the morning and at bedtime.      pravastatin  (PRAVACHOL ) 80 MG tablet Take 80 mg by mouth at bedtime.      Probiotic Product (PROBIOTIC  PO) Take 1 capsule by mouth daily.        Allergies  Allergen Reactions   Procaine Hives    Passed out     Past Medical History:  Diagnosis Date   B12 deficiency    Bilateral foot pain    Diabetes mellitus without complication (HCC)    Type II   GERD (gastroesophageal reflux disease)    Hypercholesterolemia    Hypertension    Lumbar radiculopathy    Mixed hyperlipidemia    Nonspecific low back pain    Post-menopausal bleeding    Stroke (HCC) 11/18/2018   left MCA territory infarct    Review of systems:  Otherwise negative.    Physical Exam  Gen: Alert, oriented. Appears stated age.  HEENT: [place]/AT. PERRLA. Lungs: CTA, no wheezes. CV: RR nl S1, S2. Abd: soft, benign, no masses. BS+ Ext: No edema. Pulses 2+    Planned procedures: Proceed with EGD and colonoscopy. The patient understands the nature of the planned procedure, indications, risks, alternatives and potential complications including but not limited to bleeding, infection, perforation, damage to internal organs and possible oversedation/side effects from anesthesia. The patient agrees and gives consent to proceed.  Please refer to procedure notes for findings, recommendations and patient disposition/instructions.     Altus Zaino K. Aundria, M.D. Gastroenterology 02/05/2024  8:10 AM

## 2024-02-05 NOTE — Anesthesia Preprocedure Evaluation (Signed)
 Anesthesia Evaluation  Patient identified by MRN, date of birth, ID band Patient awake    Reviewed: Allergy & Precautions, H&P , NPO status , Patient's Chart, lab work & pertinent test results, reviewed documented beta blocker date and time   Airway Mallampati: II   Neck ROM: full    Dental  (+) Poor Dentition   Pulmonary neg pulmonary ROS, former smoker   Pulmonary exam normal        Cardiovascular Exercise Tolerance: Good hypertension, On Medications negative cardio ROS Normal cardiovascular exam Rhythm:regular Rate:Normal     Neuro/Psych  Neuromuscular disease CVA  negative psych ROS   GI/Hepatic Neg liver ROS,GERD  Medicated,,  Endo/Other  negative endocrine ROSdiabetes, Well Controlled    Renal/GU negative Renal ROS  negative genitourinary   Musculoskeletal   Abdominal   Peds  Hematology negative hematology ROS (+)   Anesthesia Other Findings Past Medical History: No date: B12 deficiency No date: Bilateral foot pain No date: 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 No date: GERD (gastroesophageal reflux disease) No date: Hypercholesterolemia No date: Hypertension No date: Lumbar radiculopathy No date: Mixed hyperlipidemia No date: Nonspecific low back pain No date: Post-menopausal bleeding 11/18/2018: Stroke [facility])     Comment:  left MCA territory infarct Past Surgical History: 1990: CESAREAN SECTION 1981: CESAREAN SECTION 04/23/2019: COLONOSCOPY WITH PROPOFOL ; N/A     Comment:  Procedure: COLONOSCOPY WITH PROPOFOL ;  Surgeon: [place],               Ladell POUR, MD;  Location: [facility] ENDOSCOPY;  Service:               Gastroenterology;  Laterality: N/A; No date: COLPOSCOPY 08/24/2022: EXCISION VAGINAL CYST     Comment:  Procedure: EXCISION VAGINAL CYST;  Surgeon: Verdon Keen, MD;  Location: [facility] ORS;  Service: Gynecology;; 08/24/2022: HYSTEROSCOPY WITH D & C;  N/A     Comment:  Procedure: DILATATION AND CURETTAGE LELDON;                Surgeon: Verdon Keen, MD;  Location: [facility] ORS;                Service: Gynecology;  Laterality: N/A;   Reproductive/Obstetrics negative OB ROS                              Anesthesia Physical Anesthesia Plan  ASA: 3  Anesthesia Plan: General   Post-op Pain Management:    Induction:   PONV Risk Score and Plan:   Airway Management Planned:   Additional Equipment:   Intra-op Plan:   Post-operative Plan:   Informed Consent: I have reviewed the patients History and Physical, chart, labs and discussed the procedure including the risks, benefits and alternatives for the proposed anesthesia with the patient or authorized representative who has indicated his/her understanding and acceptance.     Dental Advisory Given  Plan Discussed with: CRNA  Anesthesia Plan Comments:         Anesthesia Quick Evaluation

## 2024-02-05 NOTE — Op Note (Signed)
 [facility] Gastroenterology Patient Name: Kara Rasmussen Procedure Date: 02/05/2024 8:18 AM MRN: [id number] Account #: [id number] Date of Birth: [birth year] Admit Type: Outpatient Age: 78 Room: [facility] ENDO ROOM 3 Gender: Female Note Status: Finalized Instrument Name: Colon Scope [id number] Procedure:             Colonoscopy Indications:           Unexplained iron deficiency anemia Providers:             Nekesha Font K. Aundria MD, MD Referring MD:          Miriam J. Mclaughlin, MD (Referring MD) Medicines:             Propofol  per Anesthesia Complications:         No immediate complications. Estimated blood loss: None. Procedure:             Pre-Anesthesia Assessment:                        - Prior to the procedure, a History and Physical was                         performed, and patient medications, allergies and                         sensitivities were reviewed. The patient's tolerance                         of previous anesthesia was reviewed.                        - The risks and benefits of the procedure and the                         sedation options and risks were discussed with the                         patient. All questions were answered and informed                         consent was obtained.                        - Patient identification and proposed procedure were                         verified prior to the procedure by the nurse. The                         procedure was verified in the procedure room.                        - ASA Grade Assessment: III - A patient with severe                         systemic disease.                        - After reviewing the risks and benefits, the patient  was deemed in satisfactory condition to undergo the                         procedure.                        After obtaining informed consent, the colonoscope was                         passed under direct vision. Throughout the  procedure,                         the patient's blood pressure, pulse, and oxygen                         saturations were monitored continuously. The                         Colonoscope was introduced through the anus and                         advanced to the the cecum, identified by appendiceal                         orifice and ileocecal valve. The colonoscopy was                         performed without difficulty. The patient tolerated                         the procedure well. The quality of the bowel                         preparation was good. The ileocecal valve, appendiceal                         orifice, and rectum were photographed. Findings:      The perianal and digital rectal examinations were normal. Pertinent       negatives include normal sphincter tone and no palpable rectal lesions.      Non-bleeding internal hemorrhoids were found during retroflexion. The       hemorrhoids were Grade I (internal hemorrhoids that do not prolapse).      A few small-mouthed diverticula were found in the sigmoid colon.      The exam was otherwise without abnormality. Impression:            - Non-bleeding internal hemorrhoids.                        - Diverticulosis in the sigmoid colon.                        - The examination was otherwise normal.                        - No specimens collected. Recommendation:        - Patient has a contact number available for                         emergencies. The  signs and symptoms of potential                         delayed complications were discussed with the patient.                         Return to normal activities tomorrow. Written                         discharge instructions were provided to the patient.                        - Resume previous diet.                        - Continue present medications.                        - You do NOT require further colon cancer screening                         measures (Annual stool  testing (i.e. hemoccult, FIT,                         cologuard), sigmoidoscopy, colonoscopy or CT                         colonography). You should share this recommendation                         with your Primary Care provider.                        - Perform an upper GI endoscopy at appointment to be                         scheduled.                        - The findings and recommendations were discussed with                         the patient. Procedure Code(s):     --- Professional ---                        [phone], Colonoscopy, flexible; diagnostic, including                         collection of specimen(s) by brushing or washing, when                         performed (separate procedure) Diagnosis Code(s):     --- Professional ---                        K57.30, Diverticulosis of large intestine without                         perforation or abscess without bleeding  D50.9, Iron deficiency anemia, unspecified                        K64.0, First degree hemorrhoids CPT copyright 2022 American Medical Association. All rights reserved. The codes documented in this report are preliminary and upon coder review may  be revised to meet current compliance requirements. Ladell MARLA Boss MD, MD 02/05/2024 8:40:22 AM This report has been signed electronically. Number of Addenda: 0 Note Initiated On: 02/05/2024 8:18 AM Scope Withdrawal Time: 0 hours 6 minutes 10 seconds  Total Procedure Duration: 0 hours 11 minutes 58 seconds  Estimated Blood Loss:  Estimated blood loss: none.      [facility]

## 2024-02-06 ENCOUNTER — Encounter: Payer: Self-pay | Admitting: Internal Medicine

## 2024-02-18 ENCOUNTER — Ambulatory Visit
Admission: RE | Admit: 2024-02-18 | Discharge: 2024-02-18 | Disposition: A | Discharge status: Home / Self Care | Source: Ambulatory Visit | Attending: Physician Assistant | Admitting: Physician Assistant

## 2024-02-18 DIAGNOSIS — Z1231 Encounter for screening mammogram for malignant neoplasm of breast: Secondary | ICD-10-CM | POA: Insufficient documentation

## 2024-02-25 ENCOUNTER — Encounter: Payer: Self-pay | Admitting: Internal Medicine

## 2024-03-04 ENCOUNTER — Other Ambulatory Visit: Payer: Self-pay

## 2024-03-04 ENCOUNTER — Ambulatory Visit: Admitting: Anesthesiology

## 2024-03-04 ENCOUNTER — Encounter: Payer: Self-pay | Admitting: Internal Medicine

## 2024-03-04 ENCOUNTER — Ambulatory Visit
Admission: RE | Admit: 2024-03-04 | Discharge: 2024-03-04 | Disposition: A | Discharge status: Home / Self Care | Attending: Internal Medicine | Admitting: Internal Medicine

## 2024-03-04 ENCOUNTER — Encounter
Admission: RE | Disposition: A | Discharge status: Home / Self Care | Payer: Self-pay | Source: Home / Self Care | Attending: Internal Medicine

## 2024-03-04 DIAGNOSIS — E119 Type 2 diabetes mellitus without complications: Secondary | ICD-10-CM | POA: Insufficient documentation

## 2024-03-04 DIAGNOSIS — K2970 Gastritis, unspecified, without bleeding: Secondary | ICD-10-CM | POA: Diagnosis not present

## 2024-03-04 DIAGNOSIS — K219 Gastro-esophageal reflux disease without esophagitis: Secondary | ICD-10-CM | POA: Diagnosis not present

## 2024-03-04 DIAGNOSIS — Z87891 Personal history of nicotine dependence: Secondary | ICD-10-CM | POA: Insufficient documentation

## 2024-03-04 DIAGNOSIS — D509 Iron deficiency anemia, unspecified: Secondary | ICD-10-CM | POA: Diagnosis present

## 2024-03-04 DIAGNOSIS — I10 Essential (primary) hypertension: Secondary | ICD-10-CM | POA: Insufficient documentation

## 2024-03-04 DIAGNOSIS — K449 Diaphragmatic hernia without obstruction or gangrene: Secondary | ICD-10-CM | POA: Insufficient documentation

## 2024-03-04 HISTORY — PX: ESOPHAGOGASTRODUODENOSCOPY: SHX5428

## 2024-03-04 SURGERY — EGD (ESOPHAGOGASTRODUODENOSCOPY)
Anesthesia: General

## 2024-03-04 MED ORDER — PROPOFOL 10 MG/ML IV BOLUS
INTRAVENOUS | Status: DC | PRN
Start: 2024-03-04 — End: 2024-03-04
  Administered 2024-03-04 (×2): 50 mg via INTRAVENOUS

## 2024-03-04 MED ORDER — LIDOCAINE HCL (CARDIAC) PF 100 MG/5ML IV SOSY
PREFILLED_SYRINGE | INTRAVENOUS | Status: DC | PRN
  Administered 2024-03-04: 80 mg via INTRAVENOUS

## 2024-03-04 MED ORDER — SODIUM CHLORIDE 0.9 % IV SOLN: INTRAVENOUS | Status: DC

## 2024-03-04 MED ORDER — PROPOFOL 500 MG/50ML IV EMUL
INTRAVENOUS | Status: DC | PRN
  Administered 2024-03-04: 75 ug/kg/min via INTRAVENOUS

## 2024-03-04 MED ORDER — GLYCOPYRROLATE 0.2 MG/ML IJ SOLN
INTRAMUSCULAR | Status: AC
  Filled 2024-03-04: qty 1

## 2024-03-04 MED ORDER — DEXMEDETOMIDINE HCL IN NACL 80 MCG/20ML IV SOLN
INTRAVENOUS | Status: DC | PRN
Start: 2024-03-04 — End: 2024-03-04
  Administered 2024-03-04: 12 ug via INTRAVENOUS
  Administered 2024-03-04: 8 ug via INTRAVENOUS

## 2024-03-04 MED ORDER — LIDOCAINE HCL (PF) 2 % IJ SOLN
INTRAMUSCULAR | Status: AC
  Filled 2024-03-04: qty 5

## 2024-03-04 NOTE — Op Note (Signed)
 [facility] Gastroenterology Patient Name: Kara Rasmussen Procedure Date: 03/04/2024 11:20 AM MRN: [id number] Account #: [id number] Date of Birth: 01-Aug-1945 Admit Type: Outpatient Age: 78 Room: [facility] ENDO ROOM 3 Gender: Female Note Status: Finalized Instrument Name: Upper GI Scope [id number] Procedure:             Upper GI endoscopy Indications:           Unexplained iron deficiency anemia, Suspected                         esophageal reflux Providers:             Jaydan Meidinger K. Aundria MD, MD Referring MD:          Miriam J. Mclaughlin, MD (Referring MD) Medicines:             Propofol  per Anesthesia Complications:         No immediate complications. Estimated blood loss: None. Procedure:             Pre-Anesthesia Assessment:                        - The risks and benefits of the procedure and the                         sedation options and risks were discussed with the                         patient. All questions were answered and informed                         consent was obtained.                        - Patient identification and proposed procedure were                         verified prior to the procedure by the nurse. The                         procedure was verified in the procedure room.                        - ASA Grade Assessment: III - A patient with severe                         systemic disease.                        - After reviewing the risks and benefits, the patient                         was deemed in satisfactory condition to undergo the                         procedure.                        After obtaining informed consent, the endoscope was  passed under direct vision. Throughout the procedure,                         the patient's blood pressure, pulse, and oxygen                         saturations were monitored continuously. The Endoscope                         was introduced through the mouth, and  advanced to the                         third part of duodenum. The upper GI endoscopy was                         accomplished without difficulty. The patient tolerated                         the procedure well. Findings:      The esophagus was normal.      Patchy mild inflammation characterized by congestion (edema) and       erythema was found in the gastric antrum.      A 2 cm hiatal hernia was present.      The examined duodenum was normal. Impression:            - Normal esophagus.                        - Gastritis.                        - 2 cm hiatal hernia.                        - Normal examined duodenum.                        - No specimens collected. Recommendation:        - Patient has a contact number available for                         emergencies. The signs and symptoms of potential                         delayed complications were discussed with the patient.                         Return to normal activities tomorrow. Written                         discharge instructions were provided to the patient.                        - Resume previous diet.                        - Continue present medications.                        - Schedule Video Capsule endoscopy of the small bowel                        -  Telephone GI office to schedule appointment at                         appointment to be scheduled.                        - The findings and recommendations were discussed with                         the patient.                        - Return to GI office in 2 months.                        - The findings and recommendations were discussed with                         the patient. Procedure Code(s):     --- Professional ---                        [phone], Esophagogastroduodenoscopy, flexible,                         transoral; diagnostic, including collection of                         specimen(s) by brushing or washing, when performed                          (separate procedure) Diagnosis Code(s):     --- Professional ---                        D50.9, Iron deficiency anemia, unspecified                        K44.9, Diaphragmatic hernia without obstruction or                         gangrene                        K29.70, Gastritis, unspecified, without bleeding CPT copyright 2022 American Medical Association. All rights reserved. The codes documented in this report are preliminary and upon coder review may  be revised to meet current compliance requirements. Ladell MARLA Boss MD, MD 03/04/2024 11:43:19 AM This report has been signed electronically. Number of Addenda: 0 Note Initiated On: 03/04/2024 11:20 AM Estimated Blood Loss:  Estimated blood loss: none.      [facility]

## 2024-03-04 NOTE — Anesthesia Preprocedure Evaluation (Signed)
 Anesthesia Evaluation  Patient identified by MRN, date of birth, ID band Patient awake    Reviewed: Allergy & Precautions, H&P , NPO status , Patient's Chart, lab work & pertinent test results, reviewed documented beta blocker date and time   Airway Mallampati: II   Neck ROM: full    Dental  (+) Poor Dentition   Pulmonary neg pulmonary ROS, former smoker   Pulmonary exam normal        Cardiovascular Exercise Tolerance: Good hypertension, On Medications negative cardio ROS Normal cardiovascular exam Rhythm:regular Rate:Normal     Neuro/Psych  Neuromuscular disease CVA, No Residual Symptoms  negative psych ROS   GI/Hepatic Neg liver ROS,GERD  Medicated,,  Endo/Other  negative endocrine ROSdiabetes, Well Controlled    Renal/GU negative Renal ROS  negative genitourinary   Musculoskeletal   Abdominal   Peds  Hematology negative hematology ROS (+)   Anesthesia Other Findings Past Medical History: No date: B12 deficiency No date: Bilateral foot pain No date: 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 No date: GERD (gastroesophageal reflux disease) No date: Hypercholesterolemia No date: Hypertension No date: Lumbar radiculopathy No date: Mixed hyperlipidemia No date: Nonspecific low back pain No date: Post-menopausal bleeding 11/18/2018: Stroke [facility])     Comment:  left MCA territory infarct Past Surgical History: 1990: CESAREAN SECTION 1981: CESAREAN SECTION 02/05/2024: COLONOSCOPY; N/A     Comment:  Procedure: COLONOSCOPY;  Surgeon: Toledo, Ladell POUR, MD;              Location: [facility];  Service: Gastroenterology;                Laterality: N/A; 04/23/2019: COLONOSCOPY WITH PROPOFOL ; N/A     Comment:  Procedure: COLONOSCOPY WITH PROPOFOL ;  Surgeon: Toledo,               Ladell POUR, MD;  Location: [facility];  Service:               Gastroenterology;  Laterality: N/A; No date:  COLPOSCOPY 08/24/2022: EXCISION VAGINAL CYST     Comment:  Procedure: EXCISION VAGINAL CYST;  Surgeon: Verdon Keen, MD;  Location: [facility] ORS;  Service: Gynecology;; 08/24/2022: HYSTEROSCOPY WITH D & C; N/A     Comment:  Procedure: DILATATION AND CURETTAGE LELDON;                Surgeon: Verdon Keen, MD;  Location: [facility] ORS;                Service: Gynecology;  Laterality: N/A; BMI    Body Mass Index: 30.96 kg/m     Reproductive/Obstetrics negative OB ROS                              Anesthesia Physical Anesthesia Plan  ASA: 3  Anesthesia Plan: General   Post-op Pain Management:    Induction:   PONV Risk Score and Plan:   Airway Management Planned:   Additional Equipment:   Intra-op Plan:   Post-operative Plan:   Informed Consent: I have reviewed the patients History and Physical, chart, labs and discussed the procedure including the risks, benefits and alternatives for the proposed anesthesia with the patient or authorized representative who has indicated his/her understanding and acceptance.     Dental Advisory Given  Plan Discussed with: CRNA  Anesthesia Plan Comments:  Anesthesia Quick Evaluation

## 2024-03-04 NOTE — H&P (Signed)
 Outpatient short stay form Pre-procedure 03/04/2024 9:34 AM Kara Rasmussen, M.D.  Primary Physician: Kara Crimes, PA  Reason for visit:  Iron deficiency anemia  History of present illness:  Kara Rasmussen reports having a vibration sensation in her chest, she specifically denies any issues with dysphagia or having trouble swallowing foods, liquids, or pills. She reports that she gets a vibration senstation throughout the day. It does not specifically occur before or after meals. She has not feel any breakthrough heartburn reflux or indigestion. Not feeling volume regurgitation. Takes PPI twice daily for many years. She does feel when she gets this vibration sensation she needs to go and lay down or recline which can make it better. Denies chest pain, SOB w/ the symptoms. Wore Holter monitor that was unremarkable.   Generally has a bowel movement daily, occasionally stools are loose. Denies any specific constipation, diarrhea, melena, rectal bleeding. No lower abd pain.   She takes aspirin  325 mg daily but denies any other OTC NSAIDs. She denies tobacco alcohol. Denies family hx colon polyps or colon cancer.    No current facility-administered medications for this encounter.  Medications Prior to Admission  Medication Sig Dispense Refill Last Dose/Taking   aspirin  325 MG tablet Take 325 mg by mouth daily.      cyanocobalamin  (VITAMIN B12) 1000 MCG tablet Take 1,000 mcg by mouth daily.      estradiol (ESTRACE) 0.1 MG/GM vaginal cream Place 1 Applicatorful vaginally at bedtime.      folic acid  (FOLVITE ) 1 MG tablet Take 1 mg by mouth daily.       lisinopril -hydrochlorothiazide  (ZESTORETIC ) 10-12.5 MG tablet Take 1 tablet by mouth daily.      metFORMIN (GLUCOPHAGE) 1000 MG tablet Take 1,000 mg by mouth 2 (two) times daily.      omeprazole (PRILOSEC) 40 MG capsule Take 40 mg by mouth in the morning and at bedtime.      pravastatin  (PRAVACHOL ) 80 MG tablet Take 80 mg by mouth at  bedtime.      Probiotic Product (PROBIOTIC PO) Take 1 capsule by mouth daily.        Allergies  Allergen Reactions   Procaine Hives    Passed out     Past Medical History:  Diagnosis Date   B12 deficiency    Bilateral foot pain    Diabetes mellitus without complication (HCC)    Type II   GERD (gastroesophageal reflux disease)    Hypercholesterolemia    Hypertension    Lumbar radiculopathy    Mixed hyperlipidemia    Nonspecific low back pain    Post-menopausal bleeding    Stroke (HCC) 11/18/2018   left MCA territory infarct    Review of systems:  Otherwise negative.    Physical Exam  Gen: Alert, oriented. Appears stated age.  HEENT: [place]/AT. PERRLA. Lungs: CTA, no wheezes. CV: RR nl S1, S2. Abd: soft, benign, no masses. BS+ Ext: No edema. Pulses 2+    Planned procedures: Proceed with EGD. The patient understands the nature of the planned procedure, indications, risks, alternatives and potential complications including but not limited to bleeding, infection, perforation, damage to internal organs and possible oversedation/side effects from anesthesia. The patient agrees and gives consent to proceed.  Please refer to procedure notes for findings, recommendations and patient disposition/instructions.     Kara Rasmussen, M.D. Gastroenterology 03/04/2024  9:34 AM

## 2024-03-04 NOTE — Anesthesia Postprocedure Evaluation (Signed)
 Anesthesia Post Note  Patient: Kara Rasmussen  Procedure(s) Performed: EGD (ESOPHAGOGASTRODUODENOSCOPY)  Patient location during evaluation: PACU Anesthesia Type: General Level of consciousness: awake and alert Pain management: pain level controlled Vital Signs Assessment: post-procedure vital signs reviewed and stable Respiratory status: spontaneous breathing, nonlabored ventilation, respiratory function stable and patient connected to nasal cannula oxygen Cardiovascular status: blood pressure returned to baseline and stable Postop Assessment: no apparent nausea or vomiting Anesthetic complications: no   No notable events documented.   Last Vitals:  Vitals:   03/04/24 1142 03/04/24 1152  BP: 123/66 108/66  Pulse: 81 78  Resp: (!) 27 19  Temp: (!) 35.8 C   SpO2: 93% 91%    Last Pain:  Vitals:   03/04/24 1142  TempSrc: Temporal  PainSc: 0-No pain                 Lynwood KANDICE Clause

## 2024-03-04 NOTE — Interval H&P Note (Signed)
 History and Physical Interval Note:  03/04/2024 9:35 AM  Kara Rasmussen  has presented today for surgery, with the diagnosis of Iron deficiency anemia, unspecified iron deficiency anemia type.  The various methods of treatment have been discussed with the patient and family. After consideration of risks, benefits and other options for treatment, the patient has consented to  Procedure(s) with comments: EGD (ESOPHAGOGASTRODUODENOSCOPY) (N/A) - DM as a surgical intervention.  The patient's history has been reviewed, patient examined, no change in status, stable for surgery.  I have reviewed the patient's chart and labs.  Questions were answered to the patient's satisfaction.     Natchez, Jujuan Dugo

## 2024-03-04 NOTE — Transfer of Care (Signed)
 Immediate Anesthesia Transfer of Care Note  Patient: Kara Rasmussen  Procedure(s) Performed: EGD (ESOPHAGOGASTRODUODENOSCOPY)  Patient Location: PACU  Anesthesia Type:General  Level of Consciousness: sedated  Airway & Oxygen Therapy: Patient Spontanous Breathing and Patient connected to nasal cannula oxygen  Post-op Assessment: Report given to RN and Post -op Vital signs reviewed and stable  Post vital signs: Reviewed and stable  Last Vitals:  Vitals Value Taken Time  BP 123/66 03/04/24 11:42  Temp    Pulse 81 03/04/24 11:42  Resp 27 03/04/24 11:42  SpO2 93 % 03/04/24 11:42  Vitals shown include unfiled device data.  Last Pain:  Vitals:   03/04/24 1010  TempSrc: Temporal  PainSc: 0-No pain         Complications: No notable events documented.

## 2024-05-20 ENCOUNTER — Inpatient Hospital Stay: Admitting: Internal Medicine

## 2024-05-20 ENCOUNTER — Inpatient Hospital Stay

## 2024-06-09 ENCOUNTER — Inpatient Hospital Stay: Payer: Self-pay | Admitting: Internal Medicine

## 2024-06-09 ENCOUNTER — Inpatient Hospital Stay: Payer: Self-pay

## 2024-06-16 ENCOUNTER — Encounter: Payer: Self-pay | Admitting: Internal Medicine

## 2024-06-16 ENCOUNTER — Inpatient Hospital Stay: Attending: Internal Medicine | Admitting: Internal Medicine

## 2024-06-16 ENCOUNTER — Inpatient Hospital Stay

## 2024-06-16 VITALS — BP 122/88 | HR 76 | Temp 96.7°F | Resp 16 | Ht 68.0 in | Wt 205.8 lb

## 2024-06-16 DIAGNOSIS — D649 Anemia, unspecified: Secondary | ICD-10-CM | POA: Diagnosis not present

## 2024-06-16 DIAGNOSIS — Z87891 Personal history of nicotine dependence: Secondary | ICD-10-CM | POA: Insufficient documentation

## 2024-06-16 LAB — IRON AND TIBC
Iron: 13 ug/dL — ABNORMAL LOW (ref 28–170)
Saturation Ratios: 3 % — ABNORMAL LOW (ref 10.4–31.8)
TIBC: 454 ug/dL — ABNORMAL HIGH (ref 250–450)
UIBC: 441 ug/dL

## 2024-06-16 LAB — URINALYSIS, COMPLETE (UACMP) WITH MICROSCOPIC
Bilirubin Urine: NEGATIVE
Glucose, UA: NEGATIVE mg/dL
Hgb urine dipstick: NEGATIVE
Ketones, ur: NEGATIVE mg/dL
Nitrite: NEGATIVE
Protein, ur: NEGATIVE mg/dL
Specific Gravity, Urine: 1.021 (ref 1.005–1.030)
WBC, UA: >50 WBC/hpf (ref 0–5)
pH: 5 (ref 5.0–8.0)

## 2024-06-16 LAB — CBC WITH DIFFERENTIAL/PLATELET
Abs Immature Granulocytes: 0.03 K/uL (ref 0.00–0.07)
Basophils Absolute: 0.1 K/uL (ref 0.0–0.1)
Basophils Relative: 1 %
Eosinophils Absolute: 0.3 K/uL (ref 0.0–0.5)
Eosinophils Relative: 3 %
HCT: 34 % — ABNORMAL LOW (ref 36.0–46.0)
Hemoglobin: 10.1 g/dL — ABNORMAL LOW (ref 12.0–15.0)
Immature Granulocytes: 0 %
Lymphocytes Relative: 37 %
Lymphs Abs: 3.3 K/uL (ref 0.7–4.0)
MCH: 19.6 pg — ABNORMAL LOW (ref 26.0–34.0)
MCHC: 29.7 g/dL — ABNORMAL LOW (ref 30.0–36.0)
MCV: 65.9 fL — ABNORMAL LOW (ref 80.0–100.0)
Monocytes Absolute: 0.6 K/uL (ref 0.1–1.0)
Monocytes Relative: 7 %
Neutro Abs: 4.6 K/uL (ref 1.7–7.7)
Neutrophils Relative %: 52 %
Platelets: 352 K/uL (ref 150–400)
RBC: 5.16 MIL/uL — ABNORMAL HIGH (ref 3.87–5.11)
RDW: 20.2 % — ABNORMAL HIGH (ref 11.5–15.5)
WBC: 8.8 K/uL (ref 4.0–10.5)
nRBC: 0 % (ref 0.0–0.2)

## 2024-06-16 LAB — COMPREHENSIVE METABOLIC PANEL WITH GFR
ALT: 13 U/L (ref 0–44)
AST: 14 U/L — ABNORMAL LOW (ref 15–41)
Albumin: 4.4 g/dL (ref 3.5–5.0)
Alkaline Phosphatase: 117 U/L (ref 38–126)
Anion gap: 14 (ref 5–15)
BUN: 8 mg/dL (ref 8–23)
CO2: 23 mmol/L (ref 22–32)
Calcium: 9.7 mg/dL (ref 8.9–10.3)
Chloride: 101 mmol/L (ref 98–111)
Creatinine, Ser: 0.85 mg/dL (ref 0.44–1.00)
GFR, Estimated: >60 mL/min
Glucose, Bld: 172 mg/dL — ABNORMAL HIGH (ref 70–99)
Potassium: 4.3 mmol/L (ref 3.5–5.1)
Sodium: 138 mmol/L (ref 135–145)
Total Bilirubin: <0.2 mg/dL (ref 0.0–1.2)
Total Protein: 7.1 g/dL (ref 6.5–8.1)

## 2024-06-16 LAB — FERRITIN: Ferritin: 9 ng/mL — ABNORMAL LOW (ref 11–307)

## 2024-06-16 LAB — RETICULOCYTES
Immature Retic Fract: 23.5 % — ABNORMAL HIGH (ref 2.3–15.9)
RBC.: 5.07 MIL/uL (ref 3.87–5.11)
Retic Count, Absolute: 46.6 K/uL (ref 19.0–186.0)
Retic Ct Pct: 0.9 % (ref 0.4–3.1)

## 2024-06-16 LAB — VITAMIN B12: Vitamin B-12: 417 pg/mL (ref 180–914)

## 2024-06-16 LAB — LACTATE DEHYDROGENASE: LDH: 165 U/L (ref 105–235)

## 2024-06-16 NOTE — Progress Notes (Signed)
 Fatigue/weakness: NO Dyspena: NO  Light headedness: NO  Blood in stool: NO

## 2024-06-16 NOTE — Progress Notes (Signed)
 [facility] CONSULT NOTE  Patient Care Team: Kara Rasmussen, [name] as PCP - General (Physician Assistant) Rennie Kara SAUNDERS, MD as Consulting Physician (Oncology)  CHIEF COMPLAINTS/PURPOSE OF CONSULTATION: ANEMIA   HEMATOLOGY HISTORY  # ANEMIA[Hb; MCV-platelets- WBC; Iron sat; ferritin;  GFR- CT/US - ;   HISTORY OF PRESENTING ILLNESS:  Kara Rasmussen 79 y.o.  female pleasant patient is  been referred to us  for further evaluation of anemia.  Discussed the use of AI scribe software for clinical note transcription with the patient, who gave verbal consent to proceed.  History of Present Illness   Kara Rasmussen is a 79 year old female with chronic iron deficiency anemia who presents for evaluation of persistent anemia and response to oral iron supplementation.  She has had anemia for over ten to twenty years without a definitive etiology. She reports that her doctor was concerned about low blood counts and low iron levels, but she does not recall the specific values. She initiated oral iron supplementation one month ago and reports some subjective improvement in symptoms.  She describes an intermittent buzzing or vibration sensation for approximately one year, which has become more consistent since September but has lessened since starting iron therapy. She feels generally well and is uncertain about significant fatigue, attributing any difficulty rising from a seated position to her sedentary work. She denies chest pain, palpitations, dyspnea, and significant gastrointestinal symptoms aside from a single episode of emesis after iron ingestion. She has not experienced constipation or ongoing gastrointestinal upset.  She underwent colonoscopy and endoscopy two months ago and reports that nothing was found. She denies hematochezia, melena, hematuria, menorrhagia, prior blood transfusions, iron infusions, or bone marrow biopsy. She has not previously been evaluated by  hematology. She notes occasional pedal edema, which she manages independently, and intermittent diarrhea that was more pronounced prior to her recent gastrointestinal procedures but has since improved.      Blood in stools: none; EGD/colonoscopy- 2 months- Blood in urine: none Difficulty swallowing: Change of bowel movement/constipation:none Prior blood transfusion: none Kidney/Liver disease: none Alcohol: none Bariatric surgery:none   Vaginal bleeding: none Prior evaluation with hematology:none Prior bone marrow biopsy: none Oral iron: since 2025.  Prior IV iron infusions: none   Review of Systems  Constitutional:  Negative for chills, diaphoresis, fever, malaise/fatigue and weight loss.  HENT:  Negative for nosebleeds and sore throat.   Eyes:  Negative for double vision.  Respiratory:  Negative for cough, hemoptysis, sputum production, shortness of breath and wheezing.   Cardiovascular:  Negative for chest pain, palpitations, orthopnea and leg swelling.  Gastrointestinal:  Negative for abdominal pain, blood in stool, constipation, diarrhea, heartburn, melena, nausea and vomiting.  Genitourinary:  Negative for dysuria, frequency and urgency.  Musculoskeletal:  Negative for back pain and joint pain.  Skin: Negative.  Negative for itching and rash.  Neurological:  Negative for dizziness, tingling, focal weakness, weakness and headaches.  Endo/Heme/Allergies:  Does not bruise/bleed easily.  Psychiatric/Behavioral:  Negative for depression. The patient is not nervous/anxious and does not have insomnia.      MEDICAL HISTORY:  Past Medical History:  Diagnosis Date   B12 deficiency    Bilateral foot pain    Diabetes mellitus without complication (HCC)    Type II   GERD (gastroesophageal reflux disease)    Hypercholesterolemia    Hypertension    Lumbar radiculopathy    Mixed hyperlipidemia    Nonspecific low back pain    Post-menopausal bleeding    Stroke (HCC)  11/18/2018    left MCA territory infarct    SURGICAL HISTORY: Past Surgical History:  Procedure Laterality Date   CESAREAN SECTION  1990   CESAREAN SECTION  1981   COLONOSCOPY N/A 02/05/2024   Procedure: COLONOSCOPY;  Surgeon: Toledo, Ladell POUR, MD;  Location: [facility];  Service: Gastroenterology;  Laterality: N/A;   COLONOSCOPY WITH PROPOFOL  N/A 04/23/2019   Procedure: COLONOSCOPY WITH PROPOFOL ;  Surgeon: Toledo, Ladell POUR, MD;  Location: [facility];  Service: Gastroenterology;  Laterality: N/A;   COLPOSCOPY     ESOPHAGOGASTRODUODENOSCOPY N/A 03/04/2024   Procedure: EGD (ESOPHAGOGASTRODUODENOSCOPY);  Surgeon: Toledo, Ladell POUR, MD;  Location: [facility];  Service: Gastroenterology;  Laterality: N/A;  DM   EXCISION VAGINAL CYST  08/24/2022   Procedure: EXCISION VAGINAL CYST;  Surgeon: Verdon Keen, MD;  Location: [facility] OR[facility];  Service: Gynecology;;   HYSTEROSCOPY WITH D & C N/A 08/24/2022   Procedure: DILATATION AND CURETTAGE LELDON;  Surgeon: Verdon Keen, MD;  Location: [facility] OR[facility];  Service: Gynecology;  Laterality: N/A;    SOCIAL HISTORY: Social History   Socioeconomic History   Marital status: Divorced    Spouse name: Not on file   Number of children: 7   Years of education: Not on file   Highest education level: Not on file  Occupational History   Not on file  Tobacco Use   Smoking status: Former   Smokeless tobacco: Never   Tobacco comments:    quit 30 years ago  Vaping Use   Vaping status: Never Used  Substance and Sexual Activity   Alcohol use: Not Currently   Drug use: Never   Sexual activity: Not Currently  Other Topics Concern   Not on file  Social History Narrative   Lives with daughter   Social Drivers of Health   Tobacco Use: Medium Risk (06/16/2024)   Patient History    Smoking Tobacco Use: Former    Smokeless Tobacco Use: Never    Passive Exposure: Not on file  Financial Resource Strain: Medium Risk (07/31/2023)   Received from [facility]   Overall Financial Resource Strain (CARDIA)    Difficulty of Paying Living Expenses: Somewhat hard  Food Insecurity: No Food Insecurity (06/16/2024)   Epic    Worried About Radiation Protection Practitioner of Food in the Last Year: Never true    Ran Out of Food in the Last Year: Never true  Transportation Needs: No Transportation Needs (06/16/2024)   Epic    Lack of Transportation (Medical): No    Lack of Transportation (Non-Medical): No  Physical Activity: Not on file  Stress: Not on file  Social Connections: Not on file  Intimate Partner Violence: Not At Risk (06/16/2024)   Epic    Fear of Current or Ex-Partner: No    Emotionally Abused: No    Physically Abused: No    Sexually Abused: No  Depression (PHQ2-9): Low Risk (06/16/2024)   Depression (PHQ2-9)    PHQ-2 Score: 0  Alcohol Screen: Not on file  Housing: Low Risk (06/16/2024)   Epic    Unable to Pay for Housing in the Last Year: No    Number of Times Moved in the Last Year: 0    Homeless in the Last Year: No  Utilities: Not At Risk (06/16/2024)   Epic    Threatened with loss of utilities: No  Health Literacy: Not on file    FAMILY HISTORY: Family History  Problem Relation Age of Onset   Hypertension Father  Breast cancer Sister 30   Diabetes Maternal Aunt    Diabetes Maternal Grandmother    Prostate cancer Son     ALLERGIES:  is allergic to procaine.  MEDICATIONS:  Current Outpatient Medications  Medication Sig Dispense Refill   aspirin  325 MG tablet Take 325 mg by mouth daily.     cyanocobalamin  (VITAMIN B12) 1000 MCG tablet Take 1,000 mcg by mouth daily.     folic acid  (FOLVITE ) 1 MG tablet Take 1 mg by mouth daily.      lisinopril -hydrochlorothiazide  (ZESTORETIC ) 10-12.5 MG tablet Take 1 tablet by mouth daily.     metFORMIN (GLUCOPHAGE) 1000 MG tablet Take 1,000 mg by mouth 2 (two) times daily.     omeprazole (PRILOSEC) 40 MG capsule Take 40 mg by mouth in the morning and at bedtime.     pravastatin   (PRAVACHOL ) 80 MG tablet Take 80 mg by mouth at bedtime.     Probiotic Product (PROBIOTIC PO) Take 1 capsule by mouth daily.     No current facility-administered medications for this visit.     PHYSICAL EXAMINATION:   Vitals:   06/16/24 1049 06/16/24 1130  BP: (!) 160/85 122/88  Pulse: 76   Resp: 16   Temp: (!) 96.7 F (35.9 C)   SpO2: 100%    Filed Weights   06/16/24 1049  Weight: 205 lb 12.8 oz (93.4 kg)    Physical Exam Vitals and nursing note reviewed.  HENT:     Head: Normocephalic and atraumatic.     Mouth/Throat:     Pharynx: Oropharynx is clear.  Eyes:     Extraocular Movements: Extraocular movements intact.     Pupils: Pupils are equal, round, and reactive to light.  Cardiovascular:     Rate and Rhythm: Normal rate and regular rhythm.  Pulmonary:     Comments: Decreased breath sounds bilaterally.  Abdominal:     Palpations: Abdomen is soft.  Musculoskeletal:        General: Normal range of motion.     Cervical back: Normal range of motion.  Skin:    General: Skin is warm.  Neurological:     General: No focal deficit present.     Mental Status: She is alert and oriented to person, place, and time.  Psychiatric:        Behavior: Behavior normal.        Judgment: Judgment normal.      LABORATORY DATA:  I have reviewed the data as listed Lab Results  Component Value Date   WBC 8.8 06/16/2024   HGB 10.1 (L) 06/16/2024   HCT 34.0 (L) 06/16/2024   MCV 65.9 (L) 06/16/2024   PLT 352 06/16/2024   Recent Labs    06/16/24 1149  NA 138  K 4.3  CL 101  CO2 23  GLUCOSE 172*  BUN 8  CREATININE 0.85  CALCIUM 9.7  GFRNONAA >60  PROT 7.1  ALBUMIN 4.4  AST 14*  ALT 13  ALKPHOS 117  BILITOT <0.2     No results found.  ASSESSMENT & PLAN:   Symptomatic anemia # Chronic microcytic anemia-hemoglobin 9-10 ; MCV 68 ferritin 3 saturation 5%  [NOV 2025-PCP] -iron deficiency; possible additional thalassemia.  Patient is mildly symptomatic from her  anemia. [Palpitations/tinnitus]  # Recommend CBC CMP LDH; iron studies ferritin B12 folic acid ; Urine analysis.   #Etiology of iron deficiency: Unclear; I had a long discussion with the patient regarding multiple etiologies of anemia including iron deficiency-which is mainly caused  by blood loss/malabsorption. S/p  GI evaluation-EGD colonoscopy; capsule study- NEG; CT scan abdomen pelvis- HOLD.  Check a urinalysis to rule out any microscopic hematuria.  Thank you Ms. Romero Antigua, PA-C. for allowing me to participate in the care of your pleasant patient. Please do not hesitate to contact me with questions or concerns in the interim.   # DISPOSITION: # labs today- UA # follow up  3 months - MD; labs- cbc/bmp;iron studies; ferritin-  possible venofer- Dr.B  Please inform patient that her labs are stable-hold off any iron infusion at this time follow-up as planned in 3 months.  She will continue iron pills as ordered by PCP  All questions were answered. The patient knows to call the clinic with any problems, questions or concerns.    Kara JONELLE Joe, MD 06/16/2024 2:20 PM

## 2024-06-16 NOTE — Assessment & Plan Note (Addendum)
#   Chronic microcytic anemia-hemoglobin 9-10 ; MCV 68 ferritin 3 saturation 5%  [NOV 2025-PCP] -iron deficiency; possible additional thalassemia.  Patient is mildly symptomatic from her anemia. [Palpitations/tinnitus]  # Recommend CBC CMP LDH; iron studies ferritin B12 folic acid ; Urine analysis.   #Etiology of iron deficiency: Unclear; I had a long discussion with the patient regarding multiple etiologies of anemia including iron deficiency-which is mainly caused by blood loss/malabsorption. S/p  GI evaluation-EGD colonoscopy; capsule study- NEG; CT scan abdomen pelvis- HOLD.  Check a urinalysis to rule out any microscopic hematuria.  Thank you Ms. Romero Antigua, PA-C. for allowing me to participate in the care of your pleasant patient. Please do not hesitate to contact me with questions or concerns in the interim.   # DISPOSITION: # labs today- UA # follow up  3 months - MD; labs- cbc/bmp;iron studies; ferritin-  possible venofer- Dr.B  Please inform patient that her labs are stable-hold off any iron infusion at this time follow-up as planned in 3 months.  She will continue iron pills as ordered by PCP

## 2024-06-17 ENCOUNTER — Telehealth: Payer: Self-pay | Admitting: *Deleted

## 2024-06-17 NOTE — Telephone Encounter (Signed)
 VM message left stating her lab work is stable. Per Dr B she does not need iron infusions at this time. Continue to take daily oral iron. Keep follow up appts for 3 months.

## 2024-06-18 LAB — HGB FRACTIONATION CASCADE
Hgb A2: 4.6 % — ABNORMAL HIGH (ref 1.8–3.2)
Hgb A: 94.8 % — ABNORMAL LOW (ref 96.4–98.8)
Hgb F: 0.6 % (ref 0.0–2.0)
Hgb S: 0 %

## 2024-09-16 ENCOUNTER — Inpatient Hospital Stay: Admitting: Internal Medicine

## 2024-09-16 ENCOUNTER — Inpatient Hospital Stay
